# Patient Record
Sex: Male | Born: 1950 | Race: White | Hispanic: No | Smoking: Current every day smoker
Health system: Southern US, Community
[De-identification: ages and names within clinical notes are randomized; demographics above are authoritative.]

## PROBLEM LIST (undated history)

## (undated) DIAGNOSIS — M6530 Trigger finger, unspecified finger: Secondary | ICD-10-CM

## (undated) DIAGNOSIS — Z87442 Personal history of urinary calculi: Secondary | ICD-10-CM

## (undated) DIAGNOSIS — I10 Essential (primary) hypertension: Secondary | ICD-10-CM

## (undated) DIAGNOSIS — C801 Malignant (primary) neoplasm, unspecified: Secondary | ICD-10-CM

## (undated) DIAGNOSIS — E119 Type 2 diabetes mellitus without complications: Secondary | ICD-10-CM

## (undated) DIAGNOSIS — J439 Emphysema, unspecified: Secondary | ICD-10-CM

## (undated) DIAGNOSIS — H269 Unspecified cataract: Secondary | ICD-10-CM

## (undated) HISTORY — DX: Unspecified cataract: H26.9

## (undated) HISTORY — PX: CHOLECYSTECTOMY: SHX55

## (undated) HISTORY — DX: Malignant (primary) neoplasm, unspecified: C80.1

## (undated) HISTORY — DX: Type 2 diabetes mellitus without complications: E11.9

## (undated) HISTORY — PX: COLONOSCOPY: SHX174

## (undated) HISTORY — PX: KIDNEY STONE SURGERY: SHX686

## (undated) HISTORY — PX: HERNIA REPAIR: SHX51

## (undated) HISTORY — DX: Emphysema, unspecified: J43.9

---

## 2001-05-17 ENCOUNTER — Emergency Department (HOSPITAL_COMMUNITY): Admission: EM | Admit: 2001-05-17 | Discharge: 2001-05-17 | Payer: Self-pay | Admitting: Emergency Medicine

## 2001-05-17 ENCOUNTER — Encounter: Payer: Self-pay | Admitting: Emergency Medicine

## 2001-05-23 ENCOUNTER — Ambulatory Visit (HOSPITAL_BASED_OUTPATIENT_CLINIC_OR_DEPARTMENT_OTHER): Admission: RE | Admit: 2001-05-23 | Discharge: 2001-05-23 | Payer: Self-pay | Admitting: Urology

## 2001-05-23 ENCOUNTER — Encounter: Payer: Self-pay | Admitting: Urology

## 2007-01-04 DIAGNOSIS — C801 Malignant (primary) neoplasm, unspecified: Secondary | ICD-10-CM

## 2007-01-04 HISTORY — DX: Malignant (primary) neoplasm, unspecified: C80.1

## 2007-01-04 HISTORY — PX: PROSTATE SURGERY: SHX751

## 2007-04-11 ENCOUNTER — Ambulatory Visit (HOSPITAL_COMMUNITY): Admission: RE | Admit: 2007-04-11 | Discharge: 2007-04-11 | Payer: Self-pay | Admitting: Urology

## 2007-06-07 ENCOUNTER — Inpatient Hospital Stay (HOSPITAL_COMMUNITY): Admission: RE | Admit: 2007-06-07 | Discharge: 2007-06-08 | Payer: Self-pay | Admitting: Urology

## 2007-06-07 ENCOUNTER — Encounter (INDEPENDENT_AMBULATORY_CARE_PROVIDER_SITE_OTHER): Payer: Self-pay | Admitting: Urology

## 2008-04-17 ENCOUNTER — Encounter (INDEPENDENT_AMBULATORY_CARE_PROVIDER_SITE_OTHER): Payer: Self-pay | Admitting: *Deleted

## 2008-06-26 ENCOUNTER — Emergency Department (HOSPITAL_COMMUNITY): Admission: EM | Admit: 2008-06-26 | Discharge: 2008-06-26 | Payer: Self-pay | Admitting: Emergency Medicine

## 2010-04-12 LAB — URINALYSIS, ROUTINE W REFLEX MICROSCOPIC
Bilirubin Urine: NEGATIVE
Hgb urine dipstick: NEGATIVE
Ketones, ur: NEGATIVE mg/dL
Nitrite: NEGATIVE
Specific Gravity, Urine: 1.028 (ref 1.005–1.030)
Urobilinogen, UA: 0.2 mg/dL (ref 0.0–1.0)
pH: 5 (ref 5.0–8.0)

## 2010-04-12 LAB — POCT I-STAT, CHEM 8
Calcium, Ion: 1.1 mmol/L — ABNORMAL LOW (ref 1.12–1.32)
Glucose, Bld: 242 mg/dL — ABNORMAL HIGH (ref 70–99)
HCT: 48 % (ref 39.0–52.0)
Hemoglobin: 16.3 g/dL (ref 13.0–17.0)
TCO2: 23 mmol/L (ref 0–100)

## 2010-04-12 LAB — DIFFERENTIAL
Basophils Absolute: 0.1 10*3/uL (ref 0.0–0.1)
Basophils Relative: 0 % (ref 0–1)
Eosinophils Absolute: 0.2 10*3/uL (ref 0.0–0.7)
Monocytes Absolute: 0.6 10*3/uL (ref 0.1–1.0)
Monocytes Relative: 5 % (ref 3–12)

## 2010-04-12 LAB — CBC
Hemoglobin: 16.1 g/dL (ref 13.0–17.0)
MCHC: 34.4 g/dL (ref 30.0–36.0)
MCV: 89.5 fL (ref 78.0–100.0)
RBC: 5.23 MIL/uL (ref 4.22–5.81)
RDW: 13.1 % (ref 11.5–15.5)

## 2010-05-18 NOTE — Op Note (Signed)
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   PROCEDURES:  1. Robotic assisted laparoscopic radical prostatectomy (left nerve      sparing).  2. Bilateral laparoscopic pelvic lymphadenectomy.   SURGEON:  Heloise Purpura, MD   ASSISTANT:  Dr. Tarri Glenn.   ANESTHESIA:  General.   COMPLICATIONS:  None.   ESTIMATED BLOOD LOSS:  150 mL.   INTRAVENOUS FLUIDS:  2000 mL of lactated Ringer's.   SPECIMENS:  1. Prostate seminal vesicles.  2. Right pelvic lymph nodes.  3. Left pelvic lymph nodes.   DISPOSITION:  Specimen to pathology.   DRAINS:  1. 20 French coude catheter.  2. #19 Blake pelvic drain.   INDICATION:  Charles Sanders is a 60 year old gentleman with clinically  localized adenocarcinoma of the prostate.  After discussion regarding  management options for treatment, he elected to proceed with surgical  therapy in the above procedures.  Potential risks, complications and  alternative treatment options were discussed in detail with the patient  and informed consent was obtained.   DESCRIPTION OF PROCEDURE:  The patient was taken to the operating room  and a general anesthetic was administered.  He was given preoperative  antibiotics, placed in the dorsal lithotomy position, prepped and draped  in the usual sterile fashion.  Next a preoperative time-out was  performed.  A Foley catheter was inserted into the bladder and a site  was selected just to the  left of the umbilicus for placement of the  camera port.  This was placed using a standard open Hasson technique.  This allowed entry into the peritoneal cavity under direct vision and  without difficulty.  A 12 mm port was then placed and the zero degree  lens was used to inspect the abdomen and there was no evidence for any  intra-abdominal injuries or other abnormalities.  The remaining ports  were then placed.  Bilateral 8 mm robotic ports were placed 10 cm  lateral to and just inferior to the camera port site.  An additional 8  mm robotic port was placed in the far left lateral abdominal wall.  A 5  mm port was placed between the camera port and the right robotic port  and an additional 12 mm port was placed in the far right lateral  abdominal wall for laparoscopic assistance.  All ports were placed under  direct vision without difficulty.  The surgical cart was then docked.  With the aid of the cautery scissors, the bladder was reflected  posteriorly allowing entry into the space of Retzius and identification  of the  endopelvic fascia and prostate.  The endopelvic fascia was  incised from the apex back to the base of the prostate bilaterally and  the underlying levator muscle fibers were swept laterally off the  prostate thereby isolating the dorsal venous complex.  The dorsal vein  was then stapled and divided with a 45 mm flex ETS stapler.  The bladder  neck was identified with the aid of Foley catheter manipulation and was  divided anteriorly exposing the Foley catheter.  The catheter balloon  was deflated and the catheter was brought into the operative field and  used to retract the prostate anteriorly.  This exposed the posterior  bladder neck which was then divided and dissection proceeded between the  bladder neck and prostate until the vasa deferentia and seminal vesicles  were identified.  The vasa deferentia were isolated, divided and lifted  anteriorly.  The seminal  vesicles were then dissected with care to  control the seminal vesicle arterial blood supply.  The seminal vesicles  were then lifted anteriorly and the space between Denonvilliers fascia  and the anterior rectum was bluntly developed thereby isolating the  vascular pedicles of the prostate.  On the right side, the posterior  plane was noted to be somewhat stuck.  Based on his preoperative rectal  exam which did demonstrate a right apical nodule and these  intraoperative findings, it was decided to perform a wide non nerve  sparing procedure on the right side.  Therefore the vascular pedicles of  the prostate were ligated with Hem-o-lok clips and divided with sharp  cold scissor dissection widely around the prostate toward the prostatic  apex.  On the left side, the lateral prostatic fascia was incised  allowing the neurovascular bundles to be released from the prostate and  Hem-o-lok clips were then used to ligate the vascular pedicles of the  prostate between the neurovascular bundle and the prostate.  Sharp cold  scissor dissection was used and the neurovascular bundle was swept off  the apex of the prostate and urethra.  The urethra was then sharply  divided allowing the prostate specimen to disarticulate.  The pelvis was  then copiously irrigated and hemostasis was ensured.  There was no  evidence for a rectal injury.  Attention then turned to the right pelvic  sidewall.   The fibrofatty tissue between the external iliac vein, confluence of the  iliac vessels, hypogastric artery and Cooper's ligament was dissected  free from the pelvic sidewall with care to preserve the obturator nerve.  Hem-o-lok clips were used for lymphostasis and hemostasis.  An identical  procedure was then performed on the contralateral side.  Both lymphatic  packets were passed off for permanent pathologic analysis.  Attention  then turned to the pelvis.   There was noted to be some bleeding from the left  neurovascular bundle.  This was controlled superficially with a 3-0 Vicryl figure-of-eight  suture.  This resulted in excellent hemostasis and attention then turned  to the urethral anastomosis.  A 2-0 Vicryl slip-knot was placed between  Denonvilliers fascia, the posterior bladder neck and posterior urethra  to reapproximate these structures.  A double-armed 3-0 Monocryl suture  was then used to perform a 360 degree running tension-free anastomosis  between the bladder neck and urethra.  A new 20 [place] coude catheter  was inserted into the bladder and irrigated.  There were no blood clots  within the bladder and the anastomosis appeared to be watertight.  A #19  Harrison Mons  drain was then brought through the left robotic port and  appropriately positioned in the pelvis.  It was secured to the skin with  a nylon suture.  The surgical cart was then undocked.  The right lateral  12 mm port site was closed with a zero Vicryl suture placed with the aid  of the suture passer device.  All remaining ports were then removed  under direct vision.  The prostate specimen was removed intact within  the Endopouch retrieval bag via the periumbilical port site.  This  fascial opening was then closed with a  running zero Vicryl suture.  All port sites were then injected with  0.25% Marcaine and reapproximated at the skin level with staples.  Sterile dressings were applied.  The patient appeared to tolerate the  procedure well without complications.  He was able to be extubated and  transferred to the recovery unit in satisfactory condition.      Heloise Purpura, MD  Electronically Signed     LB/MEDQ  D:  06/07/2007  T:  06/07/2007  Job:  [id number]

## 2010-05-21 NOTE — Op Note (Signed)
[facility]  Patient:    Charles Sanders, Charles Sanders Visit Number: [id number] MRN: [id number]          Service Type: NES Location: [facility] Attending Physician:  Katherine Roan Dictated by:   Rozanna Boer., M.D. Proc. Date: 05/23/01 Admit Date:  05/23/2001 Discharge Date: 05/23/2001                             Operative Report  PREOPERATIVE DIAGNOSIS:  Left distal ureteral stone with obstruction.  POSTOPERATIVE DIAGNOSES: 1. Left distal ureteral stone with obstruction. 2. Slight urinary extravasation distal left ureter.  ANESTHESIA:  General.  SURGEON:  Rozanna Boer., M.D.  BRIEF HISTORY:  This 60 year old white male presents with a left distal ureteral stone, his first; about 4 to 5 mm in size. The stone presented on May 17, 2001 and has not passed since then. He has required a trip to the emergency room and a trip to my office for pain and is due to go to [place] in five days and wants to go ahead and have the stone removed at this time.  DESCRIPTION OF PROCEDURE:  The patient was placed on the operating table in dorsal lithotomy position after satisfactory induction of general anesthesia. He was give Ancef IV. The #21 panendoscope was used to inspect the urethra. No anterior stricture was seen. Posterior urethra was nonobstructing. The bladder was entered. The left orifice was quite edematous and the angle was somewhat oblique. I inserted the #6 ureteral catheter, open ended, and did an occlusive retrograde which demonstrated a little bit of urinary extravasation distally. I tried numerous times to get both a guidewire and a Glidewire through the lumen but was unsuccessful to negotiate this. Then with the #6 short ureteroscope in the lumen I could see the false passage and then just anterior and lateral to this, I saw the true lumen. I was able to get the scope in this and then saw the stone. I was able to pass a  guidewire through the ureteroscope under fluoroscopy at the level of the kidney. I removed the ureteroscope and then passed a 4-cm balloon over the guidewire and inflated this to 12 atmospheres for five timed minutes. The balloon was then removed leaving the guidewire in place. The #6 short ureteroscope was then passed into the ureter beyond the stone which was then grasped with a Segura #4 wire basket and removed intact. Over the guidewire, a #6 [place] 24-cm length Double J ureteral stent was positioned in good position as the guidewire was removed. Because of the extravasation in the distal ureter, I will leave the stent for one to two weeks and therefore cut the string on the distal end, drained his bladder, gave him some Toradol IV and B&O suppository. He was sent to recovery room in good condition. He will later discharged home with detailed written instructions. Dictated by:   Rozanna Boer., M.D. Attending Physician:  Katherine Roan DD:  05/23/01 TD:  05/25/01 Job: [id number]

## 2010-09-30 LAB — HEMOGLOBIN AND HEMATOCRIT, BLOOD
HCT: 41.2
Hemoglobin: 13.8
Hemoglobin: 14.3

## 2010-09-30 LAB — BASIC METABOLIC PANEL
CO2: 30
Chloride: 102
GFR calc Af Amer: >60
Sodium: 139

## 2010-09-30 LAB — CBC
Hemoglobin: 16.5
MCHC: 35
MCV: 87.1
Platelets: 157
RBC: 5.39
WBC: 9.5

## 2010-09-30 LAB — TYPE AND SCREEN: Antibody Screen: NEGATIVE

## 2011-12-09 ENCOUNTER — Other Ambulatory Visit: Payer: Self-pay | Admitting: Orthopedic Surgery

## 2011-12-13 NOTE — Progress Notes (Signed)
Pre procedure instructions given-bring meds

## 2011-12-14 ENCOUNTER — Encounter (HOSPITAL_BASED_OUTPATIENT_CLINIC_OR_DEPARTMENT_OTHER)
Admission: RE | Disposition: A | Discharge status: Home / Self Care | Payer: Self-pay | Source: Ambulatory Visit | Attending: Orthopedic Surgery

## 2011-12-14 ENCOUNTER — Ambulatory Visit (HOSPITAL_BASED_OUTPATIENT_CLINIC_OR_DEPARTMENT_OTHER)
Admission: RE | Admit: 2011-12-14 | Discharge: 2011-12-14 | Disposition: A | Discharge status: Home / Self Care | Payer: BC Managed Care – PPO | Source: Ambulatory Visit | Attending: Orthopedic Surgery | Admitting: Orthopedic Surgery

## 2011-12-14 DIAGNOSIS — M6530 Trigger finger, unspecified finger: Secondary | ICD-10-CM | POA: Insufficient documentation

## 2011-12-14 HISTORY — PX: TRIGGER FINGER RELEASE: SHX641

## 2011-12-14 SURGERY — MINOR RELEASE TRIGGER FINGER/A-1 PULLEY
Anesthesia: LOCAL | Site: Finger | Laterality: Left | Wound class: Clean

## 2011-12-14 MED ORDER — CHLORHEXIDINE GLUCONATE 4 % EX LIQD: 60.0000 mL | Freq: Once | CUTANEOUS | Status: DC

## 2011-12-14 MED ORDER — LIDOCAINE HCL 2 % IJ SOLN
INTRAMUSCULAR | Status: DC | PRN
  Administered 2011-12-14: 3 mL

## 2011-12-14 MED ORDER — TRAMADOL HCL 50 MG PO TABS: ORAL_TABLET | ORAL | Status: DC

## 2011-12-14 SURGICAL SUPPLY — 37 items
BANDAGE ADHESIVE 1X3 (GAUZE/BANDAGES/DRESSINGS) IMPLANT
BLADE SURG 15 STRL LF DISP TIS (BLADE) ×1 IMPLANT
BLADE SURG 15 STRL SS (BLADE) ×2
BNDG CMPR 9X4 STRL LF SNTH (GAUZE/BANDAGES/DRESSINGS)
BNDG CMPR MD 5X2 ELC HKLP STRL (GAUZE/BANDAGES/DRESSINGS) ×1
BNDG ELASTIC 2 VLCR STRL LF (GAUZE/BANDAGES/DRESSINGS) ×2 IMPLANT
BNDG ESMARK 4X9 LF (GAUZE/BANDAGES/DRESSINGS) IMPLANT
BRUSH SCRUB EZ PLAIN DRY (MISCELLANEOUS) ×2 IMPLANT
CLOTH BEACON ORANGE TIMEOUT ST (SAFETY) ×1 IMPLANT
CORDS BIPOLAR (ELECTRODE) IMPLANT
COVER MAYO STAND STRL (DRAPES) ×2 IMPLANT
COVER TABLE BACK 60X90 (DRAPES) IMPLANT
CUFF TOURNIQUET SINGLE 18IN (TOURNIQUET CUFF) ×1 IMPLANT
DECANTER SPIKE VIAL GLASS SM (MISCELLANEOUS) IMPLANT
DRAPE SURG 17X23 STRL (DRAPES) ×2 IMPLANT
GAUZE SPONGE 4X4 12PLY STRL LF (GAUZE/BANDAGES/DRESSINGS) ×4 IMPLANT
GLOVE BIO SURGEON STRL SZ 6.5 (GLOVE) ×1 IMPLANT
GLOVE BIOGEL M STRL SZ7.5 (GLOVE) ×2 IMPLANT
GLOVE BIOGEL PI IND STRL 7.0 (GLOVE) IMPLANT
GLOVE BIOGEL PI INDICATOR 7.0 (GLOVE) ×1
GLOVE ORTHO TXT STRL SZ7.5 (GLOVE) ×2 IMPLANT
GOWN PREVENTION PLUS XLARGE (GOWN DISPOSABLE) ×2 IMPLANT
NEEDLE 27GAX1X1/2 (NEEDLE) ×3 IMPLANT
PACK BASIN DAY SURGERY FS (CUSTOM PROCEDURE TRAY) IMPLANT
PADDING CAST ABS 4INX4YD NS (CAST SUPPLIES) ×1
PADDING CAST ABS COTTON 4X4 ST (CAST SUPPLIES) ×1 IMPLANT
SPONGE GAUZE 4X4 12PLY (GAUZE/BANDAGES/DRESSINGS) ×2 IMPLANT
STOCKINETTE 4X48 STRL (DRAPES) ×2 IMPLANT
STRIP CLOSURE SKIN 1/2X4 (GAUZE/BANDAGES/DRESSINGS) ×2 IMPLANT
SUT PROLENE 3 0 PS 2 (SUTURE) ×2 IMPLANT
SUT PROLENE 4 0 P 3 18 (SUTURE) ×1 IMPLANT
SYR 3ML 23GX1 SAFETY (SYRINGE) IMPLANT
SYR CONTROL 10ML LL (SYRINGE) ×3 IMPLANT
TOWEL OR 17X24 6PK STRL BLUE (TOWEL DISPOSABLE) ×4 IMPLANT
TRAY DSU PREP LF (CUSTOM PROCEDURE TRAY) ×2 IMPLANT
UNDERPAD 30X30 INCONTINENT (UNDERPADS AND DIAPERS) ×2 IMPLANT
WATER STERILE IRR 1000ML POUR (IV SOLUTION) IMPLANT

## 2011-12-14 NOTE — Brief Op Note (Signed)
12/14/2011  10:07 AM  PATIENT:  Charles Sanders  61 y.o. male  PRE-OPERATIVE DIAGNOSIS:  Left Ring Finger Trigger  POST-OPERATIVE DIAGNOSIS:  left ring trigger finger  PROCEDURE:  Procedure(s) (LRB) with comments: MINOR RELEASE TRIGGER FINGER/A-1 PULLEY (Left) - ring  SURGEON:  Surgeon(s) and Role:    * Wyn Forster., MD - Primary  PHYSICIAN ASSISTANT:   ASSISTANTS: Mallory Shirk.A-C   ANESTHESIA:   local  EBL:     BLOOD ADMINISTERED:none  DRAINS: none   LOCAL MEDICATIONS USED:  XYLOCAINE   SPECIMEN:  No Specimen  DISPOSITION OF SPECIMEN:  N/A  COUNTS:  YES  TOURNIQUET:   Total Tourniquet Time Documented: Upper Arm (Left) - 6 minutes  DICTATION: .Other Dictation: Dictation Number [id number]  PLAN OF CARE: Discharge to home after PACU  PATIENT DISPOSITION:  PACU - hemodynamically stable.

## 2011-12-14 NOTE — Op Note (Signed)
[id number]

## 2011-12-14 NOTE — H&P (Signed)
    Charles Sanders was seen back for follow-up consult regarding his locking left ring finger on 03/30/11.  Trey Paula has done very well following injury to his shoulder as well as treatment of prior trigger fingers.  He has been very well controlled on your diabetes management regimen.  His hemoglobin A1C last checked was 6.1.  On examination at this time he is tender over his left ring finger A-1 pulley.  He has locking in flexion.  After informed consent and alcohol/Betadine prep he is injected with Depo Medrol and Lidocaine into his left ring finger flexor sheath.  This was well tolerated. He did well for about 4 months following the injection with complete resolution of his symptoms. He did however develop recurrent symptoms of stenosing tenosynovitis with active triggering especially in the morning hours. He now desires to undergo surgical intervention for this predicament. The procedure, risks, benefits and post-op course were discussed at length and he was in agreement with the plan.  Jonni Sanger PA-C  H&P documentation: 12/14/2011  -History and Physical Reviewed  -Patient has been re-examined  -No change in the plan of care  Wyn Forster, MD

## 2011-12-15 ENCOUNTER — Encounter (HOSPITAL_BASED_OUTPATIENT_CLINIC_OR_DEPARTMENT_OTHER): Payer: Self-pay | Admitting: Orthopedic Surgery

## 2011-12-16 NOTE — Op Note (Signed)
NAMEAVELARDO, REESMAN            ACCOUNT NO.:  [id number]  MEDICAL RECORD NO.:  [id number]  LOCATION:                                 FACILITY:  PHYSICIAN:  Katy Fitch. Emberlyn Burlison, M.D. 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  OPERATION:  Release of left ring finger A1 pulley.  OPERATING SURGEON:  Katy Fitch. Abbrielle Batts, MD  ASSISTANT:  Marveen Reeks Dasnoit, PA-C  ANESTHESIA:  Lidocaine 2% palmar block and flexor sheath block of left ring finger.  This was performed as a minor operating room procedure.  INDICATIONS:  Charles Sanders is a 61 year old gentleman, who has had a history of type 2 diabetes and chronic stenosing tenosynovitis of the left ring finger at the A1 pulley.  He has failed nonoperative measures.  Therefore he returns at this time requesting release of the A1 pulley.  Preoperatively he had detailed anesthesia and surgery informed consent. He understands that we will be performing this under local anesthesia so that he may demonstrate full active range of motion of his finger once the A1 pulley is released.  We will look for accessory pulleys as well. The surgery and aftercare were described in detail.  PROCEDURE:  Charles Sanders was brought to room 1 of the [facility] and placed in supine position on the operating table.  Following Betadine prep of his palm and anesthesia informed consent, we infiltrated 3 mL of 2% lidocaine into the path of the intended incision and the flexor sheath of the left ring finger.  After 5 minutes, excellent anesthesia was achieved.  The left hand and arm were then prepped with Betadine soap and solution, sterilely draped.  A pneumatic tourniquet was applied to  the proximal left forearm.  Following exsanguination of left arm by direct compression, the arterial tourniquet was inflated to 220 mmHg.  After confirming satisfactory anesthesia, a routine surgical time-out was called.  We then proceeded to perform a 1 cm oblique incision directly over the A1 pulley.  Subcutaneous tissues were carefully divided, taking care to identify and release the palmar fascia, pretendinous fibers.  Four Ragnell retractors were placed revealing the A1 pulley.  This was split with scalpel scissors.  There was no A0 pulley proximally.  After release of the pulley, Charles Sanders demonstrated full active range of motion of his finger in flexion and extension.  A minimal tenosynovectomy was performed  The wound was then repaired with interrupted suture of 5-0 nylon x2. For aftercare, he was placed in compressive dressing with Xeroflo sterile gauze and Ace wrap.  He will continue with range of motion exercises.  We will see him back for followup in 1 week to remove his dressing and sutures.  He is advised that he may remove the dressing and begin using Band-Aids after 3 days.  For aftercare, he is provided a prescription for Ultram 50 mg 1 p.o. q.4- 6 hours p.r.n. pain, 20 tablets without refill.  Katy Fitch Angely Dietz, M.D.     RVS/MEDQ  D:  12/14/2011  T:  12/14/2011  Job:  [id number]  cc:   Charles Sanders, M.D.

## 2013-04-03 ENCOUNTER — Encounter: Payer: Self-pay | Admitting: Internal Medicine

## 2013-04-24 ENCOUNTER — Encounter: Payer: Self-pay | Admitting: Internal Medicine

## 2013-06-03 ENCOUNTER — Encounter: Payer: Self-pay | Admitting: Internal Medicine

## 2013-08-06 ENCOUNTER — Ambulatory Visit (AMBULATORY_SURGERY_CENTER): Payer: Medicare Other | Admitting: *Deleted

## 2013-08-06 VITALS — Ht 68.0 in | Wt 164.0 lb

## 2013-08-06 DIAGNOSIS — Z800 Family history of malignant neoplasm of digestive organs: Secondary | ICD-10-CM

## 2013-08-06 MED ORDER — NA SULFATE-K SULFATE-MG SULF 17.5-3.13-1.6 GM/177ML PO SOLN: 1.0000 | Freq: Once | ORAL | Status: DC

## 2013-08-06 NOTE — Progress Notes (Signed)
No egg or soy allergy. No anesthesia problems.  No home O2.  No diet meds.  

## 2013-08-20 ENCOUNTER — Encounter: Payer: Self-pay | Admitting: Internal Medicine

## 2013-08-20 ENCOUNTER — Ambulatory Visit (AMBULATORY_SURGERY_CENTER): Payer: Medicare Other | Admitting: Internal Medicine

## 2013-08-20 VITALS — BP 112/72 | HR 78 | Temp 97.0°F | Resp 18 | Ht 68.0 in | Wt 164.0 lb

## 2013-08-20 DIAGNOSIS — Z1211 Encounter for screening for malignant neoplasm of colon: Secondary | ICD-10-CM

## 2013-08-20 DIAGNOSIS — D129 Benign neoplasm of anus and anal canal: Secondary | ICD-10-CM

## 2013-08-20 DIAGNOSIS — K5730 Diverticulosis of large intestine without perforation or abscess without bleeding: Secondary | ICD-10-CM

## 2013-08-20 DIAGNOSIS — D128 Benign neoplasm of rectum: Secondary | ICD-10-CM

## 2013-08-20 LAB — GLUCOSE, CAPILLARY
GLUCOSE-CAPILLARY: 150 mg/dL — AB (ref 70–99)
Glucose-Capillary: 147 mg/dL — ABNORMAL HIGH (ref 70–99)

## 2013-08-20 MED ORDER — SODIUM CHLORIDE 0.9 % IV SOLN: 500.0000 mL | INTRAVENOUS | Status: DC

## 2013-08-20 NOTE — Progress Notes (Signed)
Called to room to assist during endoscopic procedure.  Patient ID and intended procedure confirmed with present staff. Received instructions for my participation in the procedure from the performing physician.  

## 2013-08-20 NOTE — Patient Instructions (Addendum)
I found and removed a tiny rectal polyp that looked benign. I was not able to pick it up to send to pathology but it was very small and removed. Most of these polyps are NOT pre-cancerous. I think you can wait another 10 years to repeat a routine colonoscopy.  You also have a condition called diverticulosis - common and not usually a problem. Please read the handout provided.  Next routine colonoscopy in 10 years - 2025  I appreciate the opportunity to care for you. Charles Mayer, MD, FACG   YOU HAD AN ENDOSCOPIC PROCEDURE TODAY AT [facility]: Refer to the procedure report that was given to you for any specific questions about what was found during the examination.  If the procedure report does not answer your questions, please call your gastroenterologist to clarify.  If you requested that your care partner not be given the details of your procedure findings, then the procedure report has been included in a sealed envelope for you to review at your convenience later.  YOU SHOULD EXPECT: Some feelings of bloating in the abdomen. Passage of more gas than usual.  Walking can help get rid of the air that was put into your GI tract during the procedure and reduce the bloating. If you had a lower endoscopy (such as a colonoscopy or flexible sigmoidoscopy) you may notice spotting of blood in your stool or on the toilet paper. If you underwent a bowel prep for your procedure, then you may not have a normal bowel movement for a few days.  DIET: Your first meal following the procedure should be a light meal and then it is ok to progress to your normal diet.  A half-sandwich or bowl of soup is an example of a good first meal.  Heavy or fried foods are harder to digest and may make you feel nauseous or bloated.  Likewise meals heavy in dairy and vegetables can cause extra gas to form and this can also increase the bloating.  Drink plenty of fluids but you should avoid alcoholic beverages for  24 hours.  ACTIVITY: Your care partner should take you home directly after the procedure.  You should plan to take it easy, moving slowly for the rest of the day.  You can resume normal activity the day after the procedure however you should NOT DRIVE or use heavy machinery for 24 hours (because of the sedation medicines used during the test).    SYMPTOMS TO REPORT IMMEDIATELY: A gastroenterologist can be reached at any hour.  During normal business hours, 8:30 AM to 5:00 PM Monday through Friday, call [phone].  After hours and on weekends, please call the GI answering service at [phone] who will take a message and have the physician on call contact you.   Following lower endoscopy (colonoscopy or flexible sigmoidoscopy):  Excessive amounts of blood in the stool  Significant tenderness or worsening of abdominal pains  Swelling of the abdomen that is new, acute  Fever of 100F or higher    FOLLOW UP: If any biopsies were taken you will be contacted by phone or by letter within the next 1-3 weeks.  Call your gastroenterologist if you have not heard about the biopsies in 3 weeks.  Our staff will call the home number listed on your records the next business day following your procedure to check on you and address any questions or concerns that you may have at that time regarding the information given to  you following your procedure. This is a courtesy call and so if there is no answer at the home number and we have not heard from you through the emergency physician on call, we will assume that you have returned to your regular daily activities without incident.  SIGNATURES/CONFIDENTIALITY: You and/or your care partner have signed paperwork which will be entered into your electronic medical record.  These signatures attest to the fact that that the information above on your After Visit Summary has been reviewed and is understood.  Full responsibility of the confidentiality of this  discharge information lies with you and/or your care-partner.  Diverticulosis Diverticulosis is the condition that develops when small pouches (diverticula) form in the wall of your colon. Your colon, or large intestine, is where water is absorbed and stool is formed. The pouches form when the inside layer of your colon pushes through weak spots in the outer layers of your colon. CAUSES  No one knows exactly what causes diverticulosis. RISK FACTORS  Being older than 51. Your risk for this condition increases with age. Diverticulosis is rare in people younger than 40 years. By age 25, almost everyone has it.  Eating a low-fiber diet.  Being frequently constipated.  Being overweight.  Not getting enough exercise.  Smoking.  Taking over-the-counter pain medicines, like aspirin and ibuprofen. SYMPTOMS  Most people with diverticulosis do not have symptoms. DIAGNOSIS  Because diverticulosis often has no symptoms, health care providers often discover the condition during an exam for other colon problems. In many cases, a health care provider will diagnose diverticulosis while using a flexible scope to examine the colon (colonoscopy). TREATMENT  If you have never developed an infection related to diverticulosis, you may not need treatment. If you have had an infection before, treatment may include:  Eating more fruits, vegetables, and grains.  Taking a fiber supplement.  Taking a live bacteria supplement (probiotic).  Taking medicine to relax your colon. HOME CARE INSTRUCTIONS   Drink at least 6-8 glasses of water each day to prevent constipation.  Try not to strain when you have a bowel movement.  Keep all follow-up appointments. If you have had an infection before:  Increase the fiber in your diet as directed by your health care provider or dietitian.  Take a dietary fiber supplement if your health care provider approves.  Only take medicines as directed by your health care  provider. SEEK MEDICAL CARE IF:   You have abdominal pain.  You have bloating.  You have cramps.  You have not gone to the bathroom in 3 days. SEEK IMMEDIATE MEDICAL CARE IF:   Your pain gets worse.  Yourbloating becomes very bad.  You have a fever or chills, and your symptoms suddenly get worse.  You begin vomiting.  You have bowel movements that are bloody or black. MAKE SURE YOU:  Understand these instructions.  Will watch your condition.  Will get help right away if you are not doing well or get worse. Document Released: 09/17/2003 Document Revised: 12/25/2012 Document Reviewed: 11/14/2012 [facility] Patient Information 2015 [place], [place]. This information is not intended to replace advice given to you by your health care provider. Make sure you discuss any questions you have with your health care provider.

## 2013-08-20 NOTE — Op Note (Signed)
[place]  [employer] [place], [id number]   COLONOSCOPY PROCEDURE REPORT  PATIENT: Charles Sanders, Charles Sanders  MR#: [id number] BIRTHDATE: 02/24/1950 , 68  yrs. old GENDER: Male ENDOSCOPIST: Gatha Mayer, MD, [facility] PROCEDURE DATE:  08/20/2013 PROCEDURE:   Colonoscopy with snare polypectomy First Screening Colonoscopy - Avg.  risk and is 50 yrs.  old or older - No.  Prior Negative Screening - Now for repeat screening. 10 or more years since last screening  History of Adenoma - Now for follow-up colonoscopy & has been > or = to 3 yrs.  N/A  Polyps Removed Today? Yes. ASA CLASS:   Class II INDICATIONS:average risk screening and Last colonoscopy performed 10 years ago. MEDICATIONS: propofol (Diprivan) 250mg  IV, MAC sedation, administered by CRNA, and These medications were titrated to patient response per physician's verbal order  DESCRIPTION OF PROCEDURE:   After the risks benefits and alternatives of the procedure were thoroughly explained, informed consent was obtained.  A digital rectal exam revealed a surgically absent prostate.   The [id number]  endoscope was introduced through the anus and advanced to the cecum, which was identified by both the appendix and ileocecal valve. No adverse events experienced.   The quality of the prep was excellent using Suprep  The instrument was then slowly withdrawn as the colon was fully examined.  COLON FINDINGS: A diminutive sessile polyp was found in the rectum. A polypectomy was performed with a cold snare.  The resection was complete but the polyp tissue was not retrieved.   Severe diverticulosis was noted.   The colon mucosa was otherwise normal. A right colon retroflexion was performed.  Retroflexed views revealed no abnormalities. The time to cecum=2 minutes 18 seconds. Withdrawal time=8 minutes 16 seconds.  The scope was withdrawn and the procedure completed. COMPLICATIONS: There were no  complications.  ENDOSCOPIC IMPRESSION: 1.   Diminutive sessile polyp was found in the rectum; polypectomy was performed with a cold snare but nort recovered - endoscopic characteristics of hyperplastic polyp and tiny and single so 10 year f/u ok 2.   Severe diverticulosis was noted 3.   The colon mucosa was otherwise normal - excellent prep  RECOMMENDATIONS: Repeat Colonscopy in 10 years.   eSigned:  Gatha Mayer, MD, [facility] 08/20/2013 9:06 AM   cc: The Patient, Reynold Bowen, MD, and Raynelle Bring, MD

## 2013-08-20 NOTE — Progress Notes (Signed)
A/ox3, pleased with MAC, report to RN 

## 2013-08-21 ENCOUNTER — Telehealth: Payer: Self-pay | Admitting: *Deleted

## 2013-08-21 NOTE — Telephone Encounter (Signed)
  Follow up Call-  Call back number 08/20/2013  Post procedure Call Back phone  # [phone]  Permission to leave phone message Yes     No answer, left message.

## 2014-03-26 ENCOUNTER — Encounter (HOSPITAL_BASED_OUTPATIENT_CLINIC_OR_DEPARTMENT_OTHER)
Admission: RE | Admit: 2014-03-26 | Discharge: 2014-03-26 | Disposition: A | Discharge status: Home / Self Care | Payer: Medicare Other | Source: Ambulatory Visit | Attending: Orthopedic Surgery | Admitting: Orthopedic Surgery

## 2014-03-26 ENCOUNTER — Encounter (HOSPITAL_BASED_OUTPATIENT_CLINIC_OR_DEPARTMENT_OTHER): Payer: Self-pay | Admitting: *Deleted

## 2014-03-26 ENCOUNTER — Other Ambulatory Visit: Payer: Self-pay | Admitting: Orthopedic Surgery

## 2014-03-26 DIAGNOSIS — G5602 Carpal tunnel syndrome, left upper limb: Secondary | ICD-10-CM | POA: Diagnosis not present

## 2014-03-26 DIAGNOSIS — F17210 Nicotine dependence, cigarettes, uncomplicated: Secondary | ICD-10-CM | POA: Diagnosis not present

## 2014-03-26 DIAGNOSIS — E119 Type 2 diabetes mellitus without complications: Secondary | ICD-10-CM | POA: Diagnosis not present

## 2014-03-26 DIAGNOSIS — Z7982 Long term (current) use of aspirin: Secondary | ICD-10-CM | POA: Diagnosis not present

## 2014-03-26 DIAGNOSIS — F1099 Alcohol use, unspecified with unspecified alcohol-induced disorder: Secondary | ICD-10-CM | POA: Diagnosis not present

## 2014-03-26 DIAGNOSIS — Z9049 Acquired absence of other specified parts of digestive tract: Secondary | ICD-10-CM | POA: Diagnosis not present

## 2014-03-26 DIAGNOSIS — Z87442 Personal history of urinary calculi: Secondary | ICD-10-CM | POA: Diagnosis not present

## 2014-03-26 LAB — BASIC METABOLIC PANEL
Anion gap: 6 (ref 5–15)
BUN: 14 mg/dL (ref 6–23)
CALCIUM: 9.4 mg/dL (ref 8.4–10.5)
CO2: 36 mmol/L — AB (ref 19–32)
Chloride: 97 mmol/L (ref 96–112)
Creatinine, Ser: 0.75 mg/dL (ref 0.50–1.35)
GFR calc Af Amer: >90 mL/min (ref >90)
GFR calc non Af Amer: >90 mL/min (ref >90)
Glucose, Bld: 224 mg/dL — ABNORMAL HIGH (ref 70–99)
Potassium: 3.4 mmol/L — ABNORMAL LOW (ref 3.5–5.1)
SODIUM: 139 mmol/L (ref 135–145)

## 2014-03-26 NOTE — Progress Notes (Signed)
To come in for [id number]-bmet 

## 2014-03-27 ENCOUNTER — Encounter (HOSPITAL_BASED_OUTPATIENT_CLINIC_OR_DEPARTMENT_OTHER)
Admission: RE | Disposition: A | Discharge status: Home / Self Care | Payer: Self-pay | Source: Ambulatory Visit | Attending: Orthopedic Surgery

## 2014-03-27 ENCOUNTER — Encounter (HOSPITAL_BASED_OUTPATIENT_CLINIC_OR_DEPARTMENT_OTHER): Payer: Self-pay | Admitting: Anesthesiology

## 2014-03-27 ENCOUNTER — Ambulatory Visit (HOSPITAL_BASED_OUTPATIENT_CLINIC_OR_DEPARTMENT_OTHER)
Admission: RE | Admit: 2014-03-27 | Discharge: 2014-03-27 | Disposition: A | Discharge status: Home / Self Care | Payer: Medicare Other | Source: Ambulatory Visit | Attending: Orthopedic Surgery | Admitting: Orthopedic Surgery

## 2014-03-27 ENCOUNTER — Ambulatory Visit (HOSPITAL_BASED_OUTPATIENT_CLINIC_OR_DEPARTMENT_OTHER): Payer: Medicare Other | Admitting: Anesthesiology

## 2014-03-27 DIAGNOSIS — E119 Type 2 diabetes mellitus without complications: Secondary | ICD-10-CM | POA: Insufficient documentation

## 2014-03-27 DIAGNOSIS — F1099 Alcohol use, unspecified with unspecified alcohol-induced disorder: Secondary | ICD-10-CM | POA: Diagnosis not present

## 2014-03-27 DIAGNOSIS — Z87442 Personal history of urinary calculi: Secondary | ICD-10-CM | POA: Insufficient documentation

## 2014-03-27 DIAGNOSIS — F17210 Nicotine dependence, cigarettes, uncomplicated: Secondary | ICD-10-CM | POA: Diagnosis not present

## 2014-03-27 DIAGNOSIS — G5602 Carpal tunnel syndrome, left upper limb: Secondary | ICD-10-CM | POA: Insufficient documentation

## 2014-03-27 DIAGNOSIS — Z7982 Long term (current) use of aspirin: Secondary | ICD-10-CM | POA: Insufficient documentation

## 2014-03-27 DIAGNOSIS — Z9049 Acquired absence of other specified parts of digestive tract: Secondary | ICD-10-CM | POA: Insufficient documentation

## 2014-03-27 HISTORY — PX: CARPAL TUNNEL RELEASE: SHX101

## 2014-03-27 LAB — POCT HEMOGLOBIN-HEMACUE: HEMOGLOBIN: 16.2 g/dL (ref 13.0–17.0)

## 2014-03-27 LAB — GLUCOSE, CAPILLARY
Glucose-Capillary: 204 mg/dL — ABNORMAL HIGH (ref 70–99)
Glucose-Capillary: 165 mg/dL — ABNORMAL HIGH (ref 70–99)

## 2014-03-27 SURGERY — CARPAL TUNNEL RELEASE
Anesthesia: General | Site: Wrist | Laterality: Left

## 2014-03-27 MED ORDER — DEXAMETHASONE SODIUM PHOSPHATE 4 MG/ML IJ SOLN
INTRAMUSCULAR | Status: DC | PRN
  Administered 2014-03-27: 10 mg via INTRAVENOUS

## 2014-03-27 MED ORDER — BUPIVACAINE HCL (PF) 0.25 % IJ SOLN
INTRAMUSCULAR | Status: DC | PRN
  Administered 2014-03-27: 10 mL

## 2014-03-27 MED ORDER — CHLORHEXIDINE GLUCONATE 4 % EX LIQD: 60.0000 mL | Freq: Once | CUTANEOUS | Status: DC

## 2014-03-27 MED ORDER — LACTATED RINGERS IV SOLN
INTRAVENOUS | Status: DC
  Administered 2014-03-27: 10:00:00 via INTRAVENOUS

## 2014-03-27 MED ORDER — LIDOCAINE HCL (CARDIAC) 20 MG/ML IV SOLN
INTRAVENOUS | Status: DC | PRN
  Administered 2014-03-27: 50 mg via INTRAVENOUS

## 2014-03-27 MED ORDER — CEFAZOLIN SODIUM-DEXTROSE 2-3 GM-% IV SOLR: 2.0000 g | INTRAVENOUS | Status: DC

## 2014-03-27 MED ORDER — MIDAZOLAM HCL 2 MG/2ML IJ SOLN: 1.0000 mg | INTRAMUSCULAR | Status: DC | PRN

## 2014-03-27 MED ORDER — FENTANYL CITRATE 0.05 MG/ML IJ SOLN
INTRAMUSCULAR | Status: AC
  Filled 2014-03-27: qty 6

## 2014-03-27 MED ORDER — OXYCODONE HCL 5 MG/5ML PO SOLN: 5.0000 mg | Freq: Once | ORAL | Status: DC | PRN

## 2014-03-27 MED ORDER — FENTANYL CITRATE 0.05 MG/ML IJ SOLN: 50.0000 ug | INTRAMUSCULAR | Status: DC | PRN

## 2014-03-27 MED ORDER — CEFAZOLIN SODIUM-DEXTROSE 2-3 GM-% IV SOLR
INTRAVENOUS | Status: DC | PRN
Start: 2014-03-27 — End: 2014-03-27
  Administered 2014-03-27: 2 g via INTRAVENOUS

## 2014-03-27 MED ORDER — BUPIVACAINE HCL (PF) 0.25 % IJ SOLN
INTRAMUSCULAR | Status: AC
  Filled 2014-03-27: qty 30

## 2014-03-27 MED ORDER — ONDANSETRON HCL 4 MG/2ML IJ SOLN: 4.0000 mg | Freq: Once | INTRAMUSCULAR | Status: DC | PRN

## 2014-03-27 MED ORDER — CEFAZOLIN SODIUM-DEXTROSE 2-3 GM-% IV SOLR
INTRAVENOUS | Status: AC
  Filled 2014-03-27: qty 50

## 2014-03-27 MED ORDER — HYDROMORPHONE HCL 1 MG/ML IJ SOLN: 0.2500 mg | INTRAMUSCULAR | Status: DC | PRN

## 2014-03-27 MED ORDER — FENTANYL CITRATE 0.05 MG/ML IJ SOLN
INTRAMUSCULAR | Status: DC | PRN
  Administered 2014-03-27 (×2): 50 ug via INTRAVENOUS

## 2014-03-27 MED ORDER — OXYCODONE HCL 5 MG PO TABS: 5.0000 mg | ORAL_TABLET | Freq: Once | ORAL | Status: DC | PRN

## 2014-03-27 MED ORDER — MIDAZOLAM HCL 2 MG/2ML IJ SOLN
INTRAMUSCULAR | Status: AC
  Filled 2014-03-27: qty 2

## 2014-03-27 MED ORDER — ONDANSETRON HCL 4 MG/2ML IJ SOLN
INTRAMUSCULAR | Status: DC | PRN
  Administered 2014-03-27: 4 mg via INTRAVENOUS

## 2014-03-27 MED ORDER — MIDAZOLAM HCL 5 MG/5ML IJ SOLN
INTRAMUSCULAR | Status: DC | PRN
  Administered 2014-03-27: 2 mg via INTRAVENOUS

## 2014-03-27 SURGICAL SUPPLY — 36 items
BLADE SURG 15 STRL LF DISP TIS (BLADE) ×1 IMPLANT
BLADE SURG 15 STRL SS (BLADE) ×3
BNDG CMPR 9X4 STRL LF SNTH (GAUZE/BANDAGES/DRESSINGS) ×1
BNDG COHESIVE 3X5 TAN STRL LF (GAUZE/BANDAGES/DRESSINGS) ×3 IMPLANT
BNDG ESMARK 4X9 LF (GAUZE/BANDAGES/DRESSINGS) ×2 IMPLANT
BNDG GAUZE ELAST 4 BULKY (GAUZE/BANDAGES/DRESSINGS) ×3 IMPLANT
CHLORAPREP W/TINT 26ML (MISCELLANEOUS) ×3 IMPLANT
CORDS BIPOLAR (ELECTRODE) ×3 IMPLANT
COVER BACK TABLE 60X90IN (DRAPES) ×3 IMPLANT
COVER MAYO STAND STRL (DRAPES) ×3 IMPLANT
CUFF TOURNIQUET SINGLE 18IN (TOURNIQUET CUFF) ×3 IMPLANT
DRAPE EXTREMITY T 121X128X90 (DRAPE) ×3 IMPLANT
DRAPE SURG 17X23 STRL (DRAPES) ×3 IMPLANT
DRSG PAD ABDOMINAL 8X10 ST (GAUZE/BANDAGES/DRESSINGS) ×3 IMPLANT
GAUZE SPONGE 4X4 12PLY STRL (GAUZE/BANDAGES/DRESSINGS) ×3 IMPLANT
GAUZE XEROFORM 1X8 LF (GAUZE/BANDAGES/DRESSINGS) ×3 IMPLANT
GLOVE BIOGEL PI IND STRL 7.0 (GLOVE) IMPLANT
GLOVE BIOGEL PI IND STRL 8.5 (GLOVE) ×1 IMPLANT
GLOVE BIOGEL PI INDICATOR 7.0 (GLOVE) ×4
GLOVE BIOGEL PI INDICATOR 8.5 (GLOVE) ×2
GLOVE ECLIPSE 6.5 STRL STRAW (GLOVE) ×2 IMPLANT
GLOVE SURG ORTHO 8.0 STRL STRW (GLOVE) ×3 IMPLANT
GOWN STRL REUS W/ TWL LRG LVL3 (GOWN DISPOSABLE) ×1 IMPLANT
GOWN STRL REUS W/TWL LRG LVL3 (GOWN DISPOSABLE) ×3
GOWN STRL REUS W/TWL XL LVL3 (GOWN DISPOSABLE) ×3 IMPLANT
NDL PRECISIONGLIDE 27X1.5 (NEEDLE) IMPLANT
NEEDLE PRECISIONGLIDE 27X1.5 (NEEDLE) ×3 IMPLANT
NS IRRIG 1000ML POUR BTL (IV SOLUTION) ×3 IMPLANT
PACK BASIN DAY SURGERY FS (CUSTOM PROCEDURE TRAY) ×3 IMPLANT
STOCKINETTE 4X48 STRL (DRAPES) ×3 IMPLANT
SUT ETHILON 4 0 PS 2 18 (SUTURE) ×3 IMPLANT
SUT VICRYL 4-0 PS2 18IN ABS (SUTURE) IMPLANT
SYR BULB 3OZ (MISCELLANEOUS) ×3 IMPLANT
SYR CONTROL 10ML LL (SYRINGE) ×2 IMPLANT
TOWEL OR 17X24 6PK STRL BLUE (TOWEL DISPOSABLE) ×3 IMPLANT
UNDERPAD 30X30 INCONTINENT (UNDERPADS AND DIAPERS) ×3 IMPLANT

## 2014-03-27 NOTE — Op Note (Signed)
Dictation Number [id number]

## 2014-03-27 NOTE — Brief Op Note (Signed)
03/27/2014  12:00 PM  PATIENT:  Arnoldo Morale  64 y.o. male  PRE-OPERATIVE DIAGNOSIS:  LEFT CARPAL TUNNEL SYNDROME  POST-OPERATIVE DIAGNOSIS:  LEFT CARPAL TUNNEL SYNDROME  PROCEDURE:  Procedure(s) with comments: LEFT CARPAL TUNNEL RELEASE (Left) - ANESTHESIA:  GENERAL, IV REGIONAL/FAB  SURGEON:  Surgeon(s) and Role:    * Daryll Brod, MD - Primary  PHYSICIAN ASSISTANT:   ASSISTANTS: none   ANESTHESIA:   local and general  EBL:  Total I/O In: 800 [I.V.:800] Out: -   BLOOD ADMINISTERED:none  DRAINS: none   LOCAL MEDICATIONS USED:  BUPIVICAINE   SPECIMEN:  No Specimen  DISPOSITION OF SPECIMEN:  N/A  COUNTS:  YES  TOURNIQUET:   Total Tourniquet Time Documented: Upper Arm (Left) - 12 minutes Total: Upper Arm (Left) - 12 minutes   DICTATION: .Other Dictation: Dictation Number [id number]  PLAN OF CARE: Discharge to home after PACU  PATIENT DISPOSITION:  PACU - hemodynamically stable.

## 2014-03-27 NOTE — Discharge Instructions (Addendum)

## 2014-03-27 NOTE — Anesthesia Preprocedure Evaluation (Signed)
Anesthesia Evaluation  Patient identified by MRN, date of birth, ID band  Reviewed: Allergy & Precautions, NPO status , Patient's Chart, lab work & pertinent test results  Airway Mallampati: I  TM Distance: >3 FB Neck ROM: Full    Dental  (+) Teeth Intact, Dental Advisory Given   Pulmonary Current Smoker,  breath sounds clear to auscultation        Cardiovascular Rhythm:Regular Rate:Normal     Neuro/Psych    GI/Hepatic   Endo/Other  diabetes, Well Controlled, Type 2, Oral Hypoglycemic Agents  Renal/GU      Musculoskeletal   Abdominal   Peds  Hematology   Anesthesia Other Findings   Reproductive/Obstetrics                             Anesthesia Physical Anesthesia Plan  ASA: II  Anesthesia Plan: General   Post-op Pain Management:    Induction: Intravenous  Airway Management Planned: LMA  Additional Equipment:   Intra-op Plan:   Post-operative Plan: Extubation in OR  Informed Consent: I have reviewed the patients History and Physical, chart, labs and discussed the procedure including the risks, benefits and alternatives for the proposed anesthesia with the patient or authorized representative who has indicated his/her understanding and acceptance.   Dental advisory given  Plan Discussed with: CRNA, Anesthesiologist and Surgeon  Anesthesia Plan Comments:         Anesthesia Quick Evaluation

## 2014-03-27 NOTE — H&P (Signed)
Charles Sanders is a 64 year old right hand dominant male referred by Dr. Carrolyn Meiers for a consultation with respect to numbness and tingling in both hands, left greater than right. This has been going on for 2 months on a constant basis. He does not relate exactly how long he has had numbness and tingling in each hand. He has a history of diabetes. There is no history of thyroid problems, arthritis or gout. There is no history of injury to the hand or neck. He states it does not awaken him at night but he is up frequently because of prostate problems. He has had trigger fingers released in the past by Dr. Daylene Katayama. He has not had any treatment for this. He complains of intermittent, moderate severe, throbbing pain with numbness and weakness in the median nerve distribution. He says it is getting worse. Activity makes it worse and rest makes it better. Nerve conduction studies done by Dr. Zebedee Iba reveal a motor delay of 8.7 on the left and 11.5 on the right, sensory delay of 4.4 on the left and 4.9 on the right, amplitude diminution to 7 on each side.    PAST MEDICAL HISTORY:  He has no known drug allergies. He is on Januvia, HCTZ, and Vytorin. He has had prostate surgery, cholecystectomy, kidney stones.  FAMILY MEDICAL HISTORY: Positive for diabetes, heart disease, high BP and arthritis.  SOCIAL HISTORY:  He smokes 1  PPD advised to quit and the reasons behind this. He does not drink.  REVIEW OF SYSTEMS: Positive for glasses, otherwise negative 14 points.  Charles Sanders is an 64 y.o. male.   Chief Complaint: CTS left HPI: see above  Past Medical History  Diagnosis Date  . Diabetes mellitus without complication   . Kidney stones   . Wears glasses   . Cancer     prostate    Past Surgical History  Procedure Laterality Date  . Trigger finger release  12/14/2011    Procedure: MINOR RELEASE TRIGGER FINGER/A-1 PULLEY;  Surgeon: Cammie Sickle., MD;  Location: [facility];  Service: Orthopedics;  Laterality: Left;  ring  . Prostate surgery  2009    robotic-lap-prostatectomy  . Cholecystectomy    . Kidney stone surgery    . Hernia repair      x2  . Colonoscopy      Family History  Problem Relation Age of Onset  . Colon cancer Maternal Grandfather 69   Social History:  reports that he has been smoking Cigarettes.  He has been smoking about 1.00 pack per day. He has never used smokeless tobacco. He reports that he drinks about 0.5 oz of alcohol per week. He reports that he does not use illicit drugs.  Allergies: No Known Allergies  Medications Prior to Admission  Medication Sig Dispense Refill  . aspirin 325 MG EC tablet Take 325 mg by mouth daily. 1/2 at night    . ezetimibe-simvastatin (VYTORIN) 10-40 MG per tablet Take 1 tablet by mouth at bedtime.    Marland Kitchen glimepiride (AMARYL) 2 MG tablet Take 2 mg by mouth daily with breakfast.    . hydrochlorothiazide (MICROZIDE) 12.5 MG capsule Take 12.5 mg by mouth daily.    . Linagliptin-Metformin HCl (JENTADUETO) 2.05-998 MG TABS Take 1 tablet by mouth 2 (two) times daily.    . potassium citrate (UROCIT-K) 10 MEQ (1080 MG) SR tablet Take 10 mEq by mouth 3 (three) times daily with meals.    . Methylcellulose, Laxative, (  CITRUCEL PO) Take by mouth.      Results for orders placed or performed during the hospital encounter of 03/27/14 (from the past 48 hour(s))  Basic metabolic panel     Status: Abnormal   Collection Time: 03/26/14  2:15 PM  Result Value Ref Range   Sodium 139 135 - 145 mmol/L   Potassium 3.4 (L) 3.5 - 5.1 mmol/L   Chloride 97 96 - 112 mmol/L   CO2 36 (H) 19 - 32 mmol/L   Glucose, Bld 224 (H) 70 - 99 mg/dL   BUN 14 6 - 23 mg/dL   Creatinine, Ser 0.75 0.50 - 1.35 mg/dL   Calcium 9.4 8.4 - 10.5 mg/dL   GFR calc non Af Amer >90 >90 mL/min   GFR calc Af Amer >90 >90 mL/min    Comment: (NOTE) The eGFR has been calculated using the CKD EPI equation. This calculation has not been validated  in all clinical situations. eGFR's persistently <90 mL/min signify possible Chronic Kidney Disease.    Anion gap 6 5 - 15    No results found.   Pertinent items are noted in HPI.  Blood pressure 144/71, pulse 90, temperature 98.2 F (36.8 C), temperature source Oral, resp. rate 16, height $RemoveBe'5\' 8"'VSyeCELUb$  (1.727 m), weight 75.297 kg (166 lb), SpO2 98 %.  General appearance: alert, cooperative and appears stated age Head: Normocephalic, without obvious abnormality Neck: no JVD Resp: clear to auscultation bilaterally Cardio: regular rate and rhythm, S1, S2 normal, no murmur, click, rub or gallop GI: soft, non-tender; bowel sounds normal; no masses,  no organomegaly Extremities: numbness left hand Pulses: 2+ and symmetric Skin: Skin color, texture, turgor normal. No rashes or lesions Neurologic: Grossly normal Incision/Wound: na  Assessment/Plan IMPRESSION:    Bilateral carpal tunnel syndrome.    He would like to proceed to have this surgically released skipping any conservative treatment. Pre, peri and post op care are discussed along with risks and complications. Patient is aware there is no guarantee with surgery, possibility of infection, injury to arteries, nerves, and tendons, incomplete relief and dystrophy. He is scheduled for left carpal tunnel release at his request under regional anesthesia.  Edward Trevino R 03/27/2014, 10:19 AM

## 2014-03-27 NOTE — Anesthesia Postprocedure Evaluation (Signed)
  Anesthesia Post-op Note  Patient: Charles Sanders  Procedure(s) Performed: Procedure(s) with comments: LEFT CARPAL TUNNEL RELEASE (Left) - ANESTHESIA:  GENERAL, IV REGIONAL/FAB  Patient Location: PACU  Anesthesia Type: General   Level of Consciousness: awake, alert  and oriented  Airway and Oxygen Therapy: Patient Spontanous Breathing  Post-op Pain: mild  Post-op Assessment: Post-op Vital signs reviewed  Post-op Vital Signs: Reviewed  Last Vitals:  Filed Vitals:   03/27/14 1239  BP: 127/67  Pulse: 78  Temp: 36.4 C  Resp: 16    Complications: No apparent anesthesia complications

## 2014-03-27 NOTE — Op Note (Signed)
NAMEDEMETRIS, Charles Sanders NO.:  [id number]  MEDICAL RECORD NO.:  [id number]  LOCATION:                               FACILITY:  [facility]  PHYSICIAN:  Daryll Brod, M.D.       DATE OF BIRTH:  [birth year]  DATE OF PROCEDURE:  03/27/2014 DATE OF DISCHARGE:  03/27/2014                              OPERATIVE REPORT   PREOPERATIVE DIAGNOSIS:  Carpal tunnel syndrome, left hand.  POSTOPERATIVE DIAGNOSIS:  Carpal tunnel syndrome, left hand.  OPERATION:  Decompression, left median nerve.  SURGEON:  Daryll Brod, MD.  ANESTHESIA:  General with local infiltration.  ANESTHESIOLOGIST:  Lorrene Reid, M.D.  HISTORY:  The patient is a 64 year old male with a history of carpal tunnel syndrome, EMG nerve conduction is positive.  This has not responded to conservative treatment.  He has elected to undergo surgical release of median nerve.  Pre, peri, and postoperative course have been discussed along with risks and complications.  He is aware that there is no guarantee with the surgery; possibility of infection; recurrence of injury to arteries, nerves, tendons; incomplete relief of symptoms; and dystrophy.  In the preoperative area, the patient was seen, the extremity marked by both the patient and surgeon.  Antibiotic given.  DESCRIPTION OF PROCEDURE:  The patient was brought to the operating room where a general anesthetic was carried out without difficulty.  He was prepped using ChloraPrep in supine position with the left arm free.  A 3- minute dry time was allowed.  Time-out taken, confirming the patient and procedure.  The limb was exsanguinated with an Esmarch bandage. Tourniquet was placed high on the arm, was inflated to 250 mmHg.  A longitudinal incision was made in the left palm, carried down through subcutaneous tissue.  Bleeders were electrocauterized with bipolar.  The palmar fascia was split.  Superficial palmar arch identified.  Flexor tendon to the ring and little  finger identified to the ulnar side of median nerve.  The carpal retinaculum was incised with sharp dissection. Right angle and Sewall retractor were placed between the skin and forearm fascia.  Fascia was released for approximately 2 cm proximal to the wrist crease under direct vision.  Canal was explored.  Area of compression to the nerve was apparent.  Motor branch entered into muscle distally.  No further lesions were identified.  The wound was irrigated and the skin closed with interrupted 4-0 nylon sutures.  Local infiltration with 0.25% bupivacaine without epinephrine was given, approximately 10 mL was used.  A sterile compressive dressing with the fingers free was applied.  On deflation of the tourniquet, all fingers immediately pinked.  He was taken to the recovery room for observation in satisfactory condition.  He will be discharged home to return to the [facility] in 1 week.  He has Percocet to take at home.          ______________________________ Daryll Brod, M.D.     GK/MEDQ  D:  03/27/2014  T:  03/27/2014  Job:  [id number]

## 2014-03-27 NOTE — Transfer of Care (Signed)
Immediate Anesthesia Transfer of Care Note  Patient: Charles Sanders  Procedure(s) Performed: Procedure(s) with comments: LEFT CARPAL TUNNEL RELEASE (Left) - ANESTHESIA:  GENERAL, IV REGIONAL/FAB  Patient Location: PACU  Anesthesia Type:General  Level of Consciousness: sedated  Airway & Oxygen Therapy: Patient Spontanous Breathing and Patient connected to face mask oxygen  Post-op Assessment: Report given to RN and Post -op Vital signs reviewed and stable  Post vital signs: Reviewed and stable  Last Vitals:  Filed Vitals:   03/27/14 1155  BP:   Pulse: 77  Temp: 36.7 C  Resp: 23    Complications: No apparent anesthesia complications

## 2014-04-01 ENCOUNTER — Encounter (HOSPITAL_BASED_OUTPATIENT_CLINIC_OR_DEPARTMENT_OTHER): Payer: Self-pay | Admitting: Orthopedic Surgery

## 2014-04-10 ENCOUNTER — Other Ambulatory Visit: Payer: Self-pay | Admitting: Orthopedic Surgery

## 2014-04-28 ENCOUNTER — Encounter (HOSPITAL_BASED_OUTPATIENT_CLINIC_OR_DEPARTMENT_OTHER): Payer: Self-pay | Admitting: *Deleted

## 2014-04-28 NOTE — Progress Notes (Signed)
Here 3/16-did well-needs [employer]

## 2014-04-29 ENCOUNTER — Ambulatory Visit (HOSPITAL_BASED_OUTPATIENT_CLINIC_OR_DEPARTMENT_OTHER): Payer: Medicare Other | Admitting: Certified Registered"

## 2014-04-29 ENCOUNTER — Ambulatory Visit (HOSPITAL_BASED_OUTPATIENT_CLINIC_OR_DEPARTMENT_OTHER)
Admission: RE | Admit: 2014-04-29 | Discharge: 2014-04-29 | Disposition: A | Discharge status: Home / Self Care | Payer: Medicare Other | Source: Ambulatory Visit | Attending: Orthopedic Surgery | Admitting: Orthopedic Surgery

## 2014-04-29 ENCOUNTER — Encounter (HOSPITAL_BASED_OUTPATIENT_CLINIC_OR_DEPARTMENT_OTHER)
Admission: RE | Disposition: A | Discharge status: Home / Self Care | Payer: Self-pay | Source: Ambulatory Visit | Attending: Orthopedic Surgery

## 2014-04-29 ENCOUNTER — Encounter (HOSPITAL_BASED_OUTPATIENT_CLINIC_OR_DEPARTMENT_OTHER): Payer: Self-pay | Admitting: Orthopedic Surgery

## 2014-04-29 DIAGNOSIS — Z87442 Personal history of urinary calculi: Secondary | ICD-10-CM | POA: Diagnosis not present

## 2014-04-29 DIAGNOSIS — E119 Type 2 diabetes mellitus without complications: Secondary | ICD-10-CM | POA: Diagnosis not present

## 2014-04-29 DIAGNOSIS — F17210 Nicotine dependence, cigarettes, uncomplicated: Secondary | ICD-10-CM | POA: Diagnosis not present

## 2014-04-29 DIAGNOSIS — G5601 Carpal tunnel syndrome, right upper limb: Secondary | ICD-10-CM | POA: Diagnosis not present

## 2014-04-29 DIAGNOSIS — Z79899 Other long term (current) drug therapy: Secondary | ICD-10-CM | POA: Diagnosis not present

## 2014-04-29 DIAGNOSIS — Z9049 Acquired absence of other specified parts of digestive tract: Secondary | ICD-10-CM | POA: Insufficient documentation

## 2014-04-29 HISTORY — PX: CARPAL TUNNEL RELEASE: SHX101

## 2014-04-29 LAB — POCT I-STAT, CHEM 8
BUN: 12 mg/dL (ref 6–23)
CALCIUM ION: 1.15 mmol/L (ref 1.13–1.30)
CHLORIDE: 97 mmol/L (ref 96–112)
Creatinine, Ser: 0.6 mg/dL (ref 0.50–1.35)
Glucose, Bld: 188 mg/dL — ABNORMAL HIGH (ref 70–99)
HEMATOCRIT: 47 % (ref 39.0–52.0)
Hemoglobin: 16 g/dL (ref 13.0–17.0)
POTASSIUM: 3.5 mmol/L (ref 3.5–5.1)
Sodium: 138 mmol/L (ref 135–145)
TCO2: 27 mmol/L (ref 0–100)

## 2014-04-29 LAB — GLUCOSE, CAPILLARY: Glucose-Capillary: 158 mg/dL — ABNORMAL HIGH (ref 70–99)

## 2014-04-29 SURGERY — CARPAL TUNNEL RELEASE
Anesthesia: General | Site: Wrist | Laterality: Right

## 2014-04-29 MED ORDER — CEFAZOLIN SODIUM-DEXTROSE 2-3 GM-% IV SOLR: 2.0000 g | INTRAVENOUS | Status: DC

## 2014-04-29 MED ORDER — PROPOFOL 10 MG/ML IV BOLUS
INTRAVENOUS | Status: DC | PRN
Start: 2014-04-29 — End: 2014-04-29
  Administered 2014-04-29: 200 mg via INTRAVENOUS

## 2014-04-29 MED ORDER — FENTANYL CITRATE (PF) 100 MCG/2ML IJ SOLN: 50.0000 ug | INTRAMUSCULAR | Status: DC | PRN

## 2014-04-29 MED ORDER — CEFAZOLIN SODIUM-DEXTROSE 2-3 GM-% IV SOLR
INTRAVENOUS | Status: AC
  Filled 2014-04-29: qty 50

## 2014-04-29 MED ORDER — ONDANSETRON HCL 4 MG/2ML IJ SOLN
INTRAMUSCULAR | Status: DC | PRN
  Administered 2014-04-29: 4 mg via INTRAVENOUS

## 2014-04-29 MED ORDER — PROMETHAZINE HCL 25 MG/ML IJ SOLN: 6.2500 mg | INTRAMUSCULAR | Status: DC | PRN

## 2014-04-29 MED ORDER — OXYCODONE HCL 5 MG/5ML PO SOLN: 5.0000 mg | Freq: Once | ORAL | Status: DC | PRN

## 2014-04-29 MED ORDER — GLYCOPYRROLATE 0.2 MG/ML IJ SOLN: 0.2000 mg | Freq: Once | INTRAMUSCULAR | Status: DC | PRN

## 2014-04-29 MED ORDER — CHLORHEXIDINE GLUCONATE 4 % EX LIQD: 60.0000 mL | Freq: Once | CUTANEOUS | Status: DC

## 2014-04-29 MED ORDER — BUPIVACAINE HCL (PF) 0.25 % IJ SOLN
INTRAMUSCULAR | Status: AC
  Filled 2014-04-29: qty 30

## 2014-04-29 MED ORDER — DEXAMETHASONE SODIUM PHOSPHATE 10 MG/ML IJ SOLN
INTRAMUSCULAR | Status: DC | PRN
  Administered 2014-04-29: 10 mg via INTRAVENOUS

## 2014-04-29 MED ORDER — HYDROCODONE-ACETAMINOPHEN 5-325 MG PO TABS: 1.0000 | ORAL_TABLET | Freq: Four times a day (QID) | ORAL | Status: DC | PRN

## 2014-04-29 MED ORDER — FENTANYL CITRATE (PF) 100 MCG/2ML IJ SOLN
INTRAMUSCULAR | Status: DC | PRN
  Administered 2014-04-29 (×2): 50 ug via INTRAVENOUS

## 2014-04-29 MED ORDER — LIDOCAINE HCL (CARDIAC) 20 MG/ML IV SOLN
INTRAVENOUS | Status: DC | PRN
  Administered 2014-04-29: 60 mg via INTRAVENOUS

## 2014-04-29 MED ORDER — LACTATED RINGERS IV SOLN
INTRAVENOUS | Status: DC
  Administered 2014-04-29 (×2): via INTRAVENOUS

## 2014-04-29 MED ORDER — LIDOCAINE HCL (PF) 1 % IJ SOLN
INTRAMUSCULAR | Status: AC
  Filled 2014-04-29: qty 30

## 2014-04-29 MED ORDER — FENTANYL CITRATE (PF) 100 MCG/2ML IJ SOLN
INTRAMUSCULAR | Status: AC
  Filled 2014-04-29: qty 4

## 2014-04-29 MED ORDER — BUPIVACAINE HCL (PF) 0.25 % IJ SOLN
INTRAMUSCULAR | Status: DC | PRN
  Administered 2014-04-29: 10 mL

## 2014-04-29 MED ORDER — 0.9 % SODIUM CHLORIDE (POUR BTL) OPTIME
TOPICAL | Status: DC | PRN
  Administered 2014-04-29: 200 mL

## 2014-04-29 MED ORDER — OXYCODONE HCL 5 MG PO TABS: 5.0000 mg | ORAL_TABLET | Freq: Once | ORAL | Status: DC | PRN

## 2014-04-29 MED ORDER — MIDAZOLAM HCL 2 MG/2ML IJ SOLN: 1.0000 mg | INTRAMUSCULAR | Status: DC | PRN

## 2014-04-29 MED ORDER — HYDROMORPHONE HCL 1 MG/ML IJ SOLN: 0.2500 mg | INTRAMUSCULAR | Status: DC | PRN

## 2014-04-29 MED ORDER — CEFAZOLIN SODIUM-DEXTROSE 2-3 GM-% IV SOLR
2.0000 g | INTRAVENOUS | Status: AC
  Administered 2014-04-29: 2 g via INTRAVENOUS

## 2014-04-29 SURGICAL SUPPLY — 38 items
BLADE SURG 15 STRL LF DISP TIS (BLADE) ×1 IMPLANT
BLADE SURG 15 STRL SS (BLADE) ×3
BNDG CMPR 9X4 STRL LF SNTH (GAUZE/BANDAGES/DRESSINGS) ×1
BNDG COHESIVE 3X5 TAN STRL LF (GAUZE/BANDAGES/DRESSINGS) ×3 IMPLANT
BNDG ESMARK 4X9 LF (GAUZE/BANDAGES/DRESSINGS) ×2 IMPLANT
BNDG GAUZE ELAST 4 BULKY (GAUZE/BANDAGES/DRESSINGS) ×3 IMPLANT
CHLORAPREP W/TINT 26ML (MISCELLANEOUS) ×3 IMPLANT
CORDS BIPOLAR (ELECTRODE) ×3 IMPLANT
COVER BACK TABLE 60X90IN (DRAPES) ×3 IMPLANT
COVER MAYO STAND STRL (DRAPES) ×3 IMPLANT
CUFF TOURNIQUET SINGLE 18IN (TOURNIQUET CUFF) ×3 IMPLANT
DRAPE EXTREMITY T 121X128X90 (DRAPE) ×3 IMPLANT
DRAPE SURG 17X23 STRL (DRAPES) ×3 IMPLANT
DRSG PAD ABDOMINAL 8X10 ST (GAUZE/BANDAGES/DRESSINGS) ×3 IMPLANT
GAUZE SPONGE 4X4 12PLY STRL (GAUZE/BANDAGES/DRESSINGS) ×3 IMPLANT
GAUZE XEROFORM 1X8 LF (GAUZE/BANDAGES/DRESSINGS) ×3 IMPLANT
GLOVE BIO SURGEON STRL SZ 6.5 (GLOVE) ×1 IMPLANT
GLOVE BIO SURGEONS STRL SZ 6.5 (GLOVE) ×1
GLOVE BIOGEL PI IND STRL 7.0 (GLOVE) IMPLANT
GLOVE BIOGEL PI IND STRL 8.5 (GLOVE) ×1 IMPLANT
GLOVE BIOGEL PI INDICATOR 7.0 (GLOVE) ×2
GLOVE BIOGEL PI INDICATOR 8.5 (GLOVE) ×2
GLOVE EXAM NITRILE LRG STRL (GLOVE) ×2 IMPLANT
GLOVE SURG ORTHO 8.0 STRL STRW (GLOVE) ×3 IMPLANT
GOWN STRL REUS W/ TWL LRG LVL3 (GOWN DISPOSABLE) ×1 IMPLANT
GOWN STRL REUS W/TWL LRG LVL3 (GOWN DISPOSABLE) ×3
GOWN STRL REUS W/TWL XL LVL3 (GOWN DISPOSABLE) ×3 IMPLANT
NDL PRECISIONGLIDE 27X1.5 (NEEDLE) IMPLANT
NEEDLE PRECISIONGLIDE 27X1.5 (NEEDLE) ×3 IMPLANT
NS IRRIG 1000ML POUR BTL (IV SOLUTION) ×3 IMPLANT
PACK BASIN DAY SURGERY FS (CUSTOM PROCEDURE TRAY) ×3 IMPLANT
STOCKINETTE 4X48 STRL (DRAPES) ×3 IMPLANT
SUT ETHILON 4 0 PS 2 18 (SUTURE) ×3 IMPLANT
SUT VICRYL 4-0 PS2 18IN ABS (SUTURE) IMPLANT
SYR BULB 3OZ (MISCELLANEOUS) ×3 IMPLANT
SYR CONTROL 10ML LL (SYRINGE) ×2 IMPLANT
TOWEL OR 17X24 6PK STRL BLUE (TOWEL DISPOSABLE) ×3 IMPLANT
UNDERPAD 30X30 INCONTINENT (UNDERPADS AND DIAPERS) ×3 IMPLANT

## 2014-04-29 NOTE — Op Note (Signed)
Dictation Number [id number]

## 2014-04-29 NOTE — Anesthesia Postprocedure Evaluation (Signed)
  Anesthesia Post-op Note  Patient: Charles Sanders  Procedure(s) Performed: Procedure(s): RIGHT CARPAL TUNNEL RELEASE (Right)  Patient Location: PACU  Anesthesia Type:General  Level of Consciousness: awake and alert   Airway and Oxygen Therapy: Patient Spontanous Breathing  Post-op Pain: none  Post-op Assessment: Post-op Vital signs reviewed  Post-op Vital Signs: Reviewed  Last Vitals:  Filed Vitals:   04/29/14 0940  BP: 123/77  Pulse: 79  Temp: 36.4 C  Resp: 16    Complications: No apparent anesthesia complications

## 2014-04-29 NOTE — Anesthesia Preprocedure Evaluation (Addendum)
Anesthesia Evaluation  Patient identified by MRN, date of birth, ID band  Reviewed: Allergy & Precautions, NPO status , Patient's Chart, lab work & pertinent test results  Airway Mallampati: I  TM Distance: >3 FB Neck ROM: Full    Dental  (+) Teeth Intact, Dental Advisory Given   Pulmonary Current Smoker,  breath sounds clear to auscultation        Cardiovascular Rhythm:Regular Rate:Normal     Neuro/Psych    GI/Hepatic   Endo/Other  diabetes, Well Controlled, Type 2, Oral Hypoglycemic Agents  Renal/GU      Musculoskeletal   Abdominal   Peds  Hematology   Anesthesia Other Findings   Reproductive/Obstetrics                            Anesthesia Physical  Anesthesia Plan  ASA: II  Anesthesia Plan: General   Post-op Pain Management:    Induction: Intravenous  Airway Management Planned: LMA  Additional Equipment:   Intra-op Plan:   Post-operative Plan: Extubation in OR  Informed Consent: I have reviewed the patients History and Physical, chart, labs and discussed the procedure including the risks, benefits and alternatives for the proposed anesthesia with the patient or authorized representative who has indicated his/her understanding and acceptance.   Dental advisory given  Plan Discussed with: CRNA, Anesthesiologist and Surgeon  Anesthesia Plan Comments:         Anesthesia Quick Evaluation

## 2014-04-29 NOTE — Discharge Instructions (Addendum)

## 2014-04-29 NOTE — Brief Op Note (Signed)
04/29/2014  9:15 AM  PATIENT:  Charles Sanders  64 y.o. male  PRE-OPERATIVE DIAGNOSIS:  Right carpal tunnel syndrome G56.01  POST-OPERATIVE DIAGNOSIS:  Right carpal tunnel syndrome G56.01  PROCEDURE:  Procedure(s): RIGHT CARPAL TUNNEL RELEASE (Right)  SURGEON:  Surgeon(s) and Role:    * Daryll Brod, MD - Primary  PHYSICIAN ASSISTANT:   ASSISTANTS: none   ANESTHESIA:   local and general  EBL:  Total I/O In: 1000 [I.V.:1000] Out: -   BLOOD ADMINISTERED:none  DRAINS: none   LOCAL MEDICATIONS USED:  BUPIVICAINE   SPECIMEN:  No Specimen  DISPOSITION OF SPECIMEN:  N/A  COUNTS:  YES  TOURNIQUET:   Total Tourniquet Time Documented: Upper Arm (Right) - 13 minutes Total: Upper Arm (Right) - 13 minutes   DICTATION: .Other Dictation: Dictation Number [id number]  PLAN OF CARE: Discharge to home after PACU  PATIENT DISPOSITION:  PACU - hemodynamically stable.

## 2014-04-29 NOTE — Transfer of Care (Signed)
Immediate Anesthesia Transfer of Care Note  Patient: Charles Sanders  Procedure(s) Performed: Procedure(s): RIGHT CARPAL TUNNEL RELEASE (Right)  Patient Location: PACU  Anesthesia Type:General  Level of Consciousness: awake and patient cooperative  Airway & Oxygen Therapy: Patient Spontanous Breathing and Patient connected to face mask oxygen  Post-op Assessment: Report given to RN and Post -op Vital signs reviewed and stable  Post vital signs: Reviewed and stable  Last Vitals:  Filed Vitals:   04/29/14 0747  BP: 130/70  Pulse: 92  Temp: 36.7 C  Resp: 20    Complications: No apparent anesthesia complications

## 2014-04-29 NOTE — H&P (Signed)
Charles Sanders is a 64 year old right hand dominant male referred by Dr. Carrolyn Meiers for a consultation with respect to numbness and tingling in both hands, left greater than right. This has been going on for 2 months on a constant basis. He does not relate exactly how long he has had numbness and tingling in each hand. He has a history of diabetes. There is no history of thyroid problems, arthritis or gout. There is no history of injury to the hand or neck. He states it does not awaken him at night but he is up frequently because of prostate problems. He has had trigger fingers released in the past by Dr. Daylene Katayama. He has not had any treatment for this. He complains of intermittent, moderate severe, throbbing pain with numbness and weakness in the median nerve distribution. He says it is getting worse. Activity makes it worse and rest makes it better.He has had nerve conduction studies done by Dr. Zebedee Iba. This reveals a motor delay of 8.7 on the left and 11.5 on the right, sensory delay of 4.4 on the left and 4.9 on the right, amplitude diminution to 7 on each side.    PAST MEDICAL HISTORY:  He has no known drug allergies. He is on Januvia, HCTZ, and Vytorin. He has had prostate surgery, cholecystectomy, kidney stones.  FAMILY MEDICAL HISTORY: Positive for diabetes, heart disease, high BP and arthritis.  SOCIAL HISTORY:  He smokes 1  PPD advised to quit and the reasons behind this. He does not drink.  REVIEW OF SYSTEMS: Positive for glasses, otherwise negative 14 points.  Charles Sanders is an 64 y.o. male.   Chief Complaint: CTS right HPI: see above  Past Medical History  Diagnosis Date  . Diabetes mellitus without complication   . Kidney stones   . Wears glasses   . Cancer     prostate    Past Surgical History  Procedure Laterality Date  . Trigger finger release  12/14/2011    Procedure: MINOR RELEASE TRIGGER FINGER/A-1 PULLEY;  Surgeon: Cammie Sickle., MD;  Location: [facility];  Service: Orthopedics;  Laterality: Left;  ring  . Prostate surgery  2009    robotic-lap-prostatectomy  . Cholecystectomy    . Kidney stone surgery    . Hernia repair      x2  . Colonoscopy    . Carpal tunnel release Left 03/27/2014    Procedure: LEFT CARPAL TUNNEL RELEASE;  Surgeon: Daryll Brod, MD;  Location: [facility];  Service: Orthopedics;  Laterality: Left;  ANESTHESIA:  GENERAL, IV REGIONAL/FAB    Family History  Problem Relation Age of Onset  . Colon cancer Maternal Grandfather 51   Social History:  reports that he has been smoking Cigarettes.  He has been smoking about 1.00 pack per day. He has never used smokeless tobacco. He reports that he drinks about 0.5 oz of alcohol per week. He reports that he does not use illicit drugs.  Allergies: No Known Allergies  No prescriptions prior to admission    No results found for this or any previous visit (from the past 48 hour(s)).  No results found.   Pertinent items are noted in HPI.  Height 5\' 8"  (1.727 m), weight 75.297 kg (166 lb).  General appearance: alert, cooperative and appears stated age Head: Normocephalic, without obvious abnormality Neck: no JVD Resp: clear to auscultation bilaterally Cardio: regular rate and rhythm, S1, S2 normal, no murmur, click, rub or gallop GI:  soft, non-tender; bowel sounds normal; no masses,  no organomegaly Extremities: numbness right hand Pulses: 2+ and symmetric Skin: Skin color, texture, turgor normal. No rashes or lesions Neurologic: Grossly normal Incision/Wound: na  Assessment/Plan [name] would also like to proceed with right carpal tunnel release as he has well documented severe right carpal tunnel syndrome by electrodiagnostic studies. Pre, peri and post op care are discussed along with risks and complications. He is in agreement with this plan. He will therefore be scheduled for right carpal tunnel release.  Imagene Boss  R 04/29/2014, 5:31 AM

## 2014-04-29 NOTE — Anesthesia Procedure Notes (Signed)
Procedure Name: LMA Insertion Date/Time: 04/29/2014 8:42 AM Performed by: Ommie Degeorge D Pre-anesthesia Checklist: Patient identified, Emergency Drugs available, Suction available and Patient being monitored Patient Re-evaluated:Patient Re-evaluated prior to inductionOxygen Delivery Method: Circle System Utilized Preoxygenation: Pre-oxygenation with 100% oxygen Intubation Type: IV induction Ventilation: Mask ventilation without difficulty LMA: LMA inserted LMA Size: 4.0 Number of attempts: 1 Airway Equipment and Method: Bite block Placement Confirmation: positive ETCO2 Tube secured with: Tape Dental Injury: Teeth and Oropharynx as per pre-operative assessment

## 2014-04-30 ENCOUNTER — Encounter (HOSPITAL_BASED_OUTPATIENT_CLINIC_OR_DEPARTMENT_OTHER): Payer: Self-pay | Admitting: Orthopedic Surgery

## 2014-04-30 NOTE — Op Note (Signed)
Charles Sanders, [name]:  [id number]  MEDICAL RECORD NO.:  [id number]  LOCATION:                                 FACILITY:  PHYSICIAN:  Daryll Brod, M.D.            DATE OF BIRTH:  DATE OF PROCEDURE:  04/29/2014 DATE OF DISCHARGE:                              OPERATIVE REPORT   PREOPERATIVE DIAGNOSIS:  Carpal tunnel syndrome, right hand.  POSTOPERATIVE DIAGNOSIS:  Carpal tunnel syndrome, right hand.  OPERATION:  Decompression of right median nerve.  SURGEON:  Daryll Brod, M.D.  ANESTHESIA:  General with local infiltration.  ANESTHESIOLOGIST:  Glynda Jaeger, M.D.  HISTORY:  The patient is a 64 year old male with a history of carpal tunnel syndrome bilaterally.  She has undergone release of the left side.  He is admitted now for release of the right.  Nerve conduction is positive, and this is not responded to conservative treatment.  He is aware that there is no guarantee with surgery; possibility of infection; recurrence; injury to arteries, nerves, tendons; incomplete relief of symptoms; dystrophy.  In preoperative area, the patient is seen, the extremity was marked by both the patient and surgeon.  Antibiotic given.  PROCEDURE IN DETAIL:  The patient was brought to the operating room, where a general anesthetic was carried out without difficulty.  He was prepped using ChloraPrep, supine position, right arm free.  A 3-minute dry time was allowed.  Time-out taken confirming the patient and procedure.  The limb was exsanguinated with an Esmarch bandage. Tourniquet placed high on the arm and was inflated to 250 mmHg.  A longitudinal incision was made in the right palm, carried down through subcutaneous tissue.  Bleeders were electrocauterized.  Palmar fascia was split.  Superficial palmar arch identified.  Flexor tendon of the ring and little finger identified to the ulnar side of median nerve. Carpal retinaculum was incised with sharp dissection.   Right angle and Sewall retractors were placed between skin and forearm fascia.  The fascia released for approximately 1.5 cm to 2 cm under direct vision. Canal was explored.  Air compression to the nerve.  An hourglass deformity was noted.  The motor branch entered into the muscle.  No further lesions were identified.  The wound was copiously irrigated with saline.  The skin was closed with interrupted 4-0 nylon sutures.  A local infiltration with 0.25% bupivacaine without epinephrine was given, approximately 8 mL to 9 mL was used.  A sterile compressive dressing with the fingers free was applied.  On deflation of the tourniquet, all fingers immediately pinked.  He was taken to the recovery room for observation in satisfactory condition. She will be discharged home to return to the [facility] in 1 week on Norco.          ______________________________ Daryll Brod, M.D.     GK/MEDQ  D:  04/29/2014  T:  04/29/2014  Job:  [id number]

## 2014-04-30 NOTE — Addendum Note (Signed)
Addendum  created 04/30/14 8333 by Ernesta Amble Hallie Ishida, CRNA   Modules edited: Anesthesia Events

## 2015-01-12 DIAGNOSIS — E119 Type 2 diabetes mellitus without complications: Secondary | ICD-10-CM | POA: Diagnosis not present

## 2015-01-12 DIAGNOSIS — Z6823 Body mass index (BMI) 23.0-23.9, adult: Secondary | ICD-10-CM | POA: Diagnosis not present

## 2015-01-12 DIAGNOSIS — R0989 Other specified symptoms and signs involving the circulatory and respiratory systems: Secondary | ICD-10-CM | POA: Diagnosis not present

## 2015-01-12 DIAGNOSIS — N200 Calculus of kidney: Secondary | ICD-10-CM | POA: Diagnosis not present

## 2015-01-12 DIAGNOSIS — Z720 Tobacco use: Secondary | ICD-10-CM | POA: Diagnosis not present

## 2015-01-12 DIAGNOSIS — J209 Acute bronchitis, unspecified: Secondary | ICD-10-CM | POA: Diagnosis not present

## 2015-01-12 DIAGNOSIS — I959 Hypotension, unspecified: Secondary | ICD-10-CM | POA: Diagnosis not present

## 2015-02-02 DIAGNOSIS — E119 Type 2 diabetes mellitus without complications: Secondary | ICD-10-CM | POA: Diagnosis not present

## 2015-02-02 DIAGNOSIS — M65311 Trigger thumb, right thumb: Secondary | ICD-10-CM | POA: Diagnosis not present

## 2015-04-21 DIAGNOSIS — Z6824 Body mass index (BMI) 24.0-24.9, adult: Secondary | ICD-10-CM | POA: Diagnosis not present

## 2015-04-21 DIAGNOSIS — Z1389 Encounter for screening for other disorder: Secondary | ICD-10-CM | POA: Diagnosis not present

## 2015-04-21 DIAGNOSIS — E119 Type 2 diabetes mellitus without complications: Secondary | ICD-10-CM | POA: Diagnosis not present

## 2015-05-07 DIAGNOSIS — H2511 Age-related nuclear cataract, right eye: Secondary | ICD-10-CM | POA: Diagnosis not present

## 2015-05-07 DIAGNOSIS — H25011 Cortical age-related cataract, right eye: Secondary | ICD-10-CM | POA: Diagnosis not present

## 2015-05-07 DIAGNOSIS — H25012 Cortical age-related cataract, left eye: Secondary | ICD-10-CM | POA: Diagnosis not present

## 2015-05-07 DIAGNOSIS — H25042 Posterior subcapsular polar age-related cataract, left eye: Secondary | ICD-10-CM | POA: Diagnosis not present

## 2015-05-26 DIAGNOSIS — H2511 Age-related nuclear cataract, right eye: Secondary | ICD-10-CM | POA: Diagnosis not present

## 2015-06-17 DIAGNOSIS — H25012 Cortical age-related cataract, left eye: Secondary | ICD-10-CM | POA: Diagnosis not present

## 2015-06-17 DIAGNOSIS — H2512 Age-related nuclear cataract, left eye: Secondary | ICD-10-CM | POA: Diagnosis not present

## 2015-06-23 DIAGNOSIS — H25812 Combined forms of age-related cataract, left eye: Secondary | ICD-10-CM | POA: Diagnosis not present

## 2015-06-23 DIAGNOSIS — H2512 Age-related nuclear cataract, left eye: Secondary | ICD-10-CM | POA: Diagnosis not present

## 2015-07-17 ENCOUNTER — Ambulatory Visit (INDEPENDENT_AMBULATORY_CARE_PROVIDER_SITE_OTHER): Payer: PPO | Admitting: Licensed Clinical Social Worker

## 2015-07-17 DIAGNOSIS — F3481 Disruptive mood dysregulation disorder: Secondary | ICD-10-CM

## 2015-07-31 ENCOUNTER — Ambulatory Visit (INDEPENDENT_AMBULATORY_CARE_PROVIDER_SITE_OTHER): Payer: PPO | Admitting: Licensed Clinical Social Worker

## 2015-07-31 DIAGNOSIS — F4320 Adjustment disorder, unspecified: Secondary | ICD-10-CM | POA: Diagnosis not present

## 2015-08-05 DIAGNOSIS — E1151 Type 2 diabetes mellitus with diabetic peripheral angiopathy without gangrene: Secondary | ICD-10-CM | POA: Diagnosis not present

## 2015-08-05 DIAGNOSIS — E784 Other hyperlipidemia: Secondary | ICD-10-CM | POA: Diagnosis not present

## 2015-08-05 DIAGNOSIS — K635 Polyp of colon: Secondary | ICD-10-CM | POA: Diagnosis not present

## 2015-08-05 DIAGNOSIS — C61 Malignant neoplasm of prostate: Secondary | ICD-10-CM | POA: Diagnosis not present

## 2015-08-05 DIAGNOSIS — Z6823 Body mass index (BMI) 23.0-23.9, adult: Secondary | ICD-10-CM | POA: Diagnosis not present

## 2015-08-05 DIAGNOSIS — Z720 Tobacco use: Secondary | ICD-10-CM | POA: Diagnosis not present

## 2015-08-05 DIAGNOSIS — I7389 Other specified peripheral vascular diseases: Secondary | ICD-10-CM | POA: Diagnosis not present

## 2015-08-05 DIAGNOSIS — D751 Secondary polycythemia: Secondary | ICD-10-CM | POA: Diagnosis not present

## 2015-08-05 DIAGNOSIS — N200 Calculus of kidney: Secondary | ICD-10-CM | POA: Diagnosis not present

## 2015-08-10 DIAGNOSIS — Z961 Presence of intraocular lens: Secondary | ICD-10-CM | POA: Diagnosis not present

## 2015-08-21 ENCOUNTER — Ambulatory Visit: Payer: PPO | Admitting: Licensed Clinical Social Worker

## 2015-10-03 DIAGNOSIS — Z23 Encounter for immunization: Secondary | ICD-10-CM | POA: Diagnosis not present

## 2015-11-20 DIAGNOSIS — N5201 Erectile dysfunction due to arterial insufficiency: Secondary | ICD-10-CM | POA: Diagnosis not present

## 2015-11-20 DIAGNOSIS — Z8546 Personal history of malignant neoplasm of prostate: Secondary | ICD-10-CM | POA: Diagnosis not present

## 2015-11-20 DIAGNOSIS — Z8639 Personal history of other endocrine, nutritional and metabolic disease: Secondary | ICD-10-CM | POA: Diagnosis not present

## 2015-12-08 DIAGNOSIS — E1151 Type 2 diabetes mellitus with diabetic peripheral angiopathy without gangrene: Secondary | ICD-10-CM | POA: Diagnosis not present

## 2015-12-08 DIAGNOSIS — I7389 Other specified peripheral vascular diseases: Secondary | ICD-10-CM | POA: Diagnosis not present

## 2015-12-08 DIAGNOSIS — C61 Malignant neoplasm of prostate: Secondary | ICD-10-CM | POA: Diagnosis not present

## 2015-12-08 DIAGNOSIS — K635 Polyp of colon: Secondary | ICD-10-CM | POA: Diagnosis not present

## 2015-12-08 DIAGNOSIS — E784 Other hyperlipidemia: Secondary | ICD-10-CM | POA: Diagnosis not present

## 2015-12-08 DIAGNOSIS — D751 Secondary polycythemia: Secondary | ICD-10-CM | POA: Diagnosis not present

## 2015-12-08 DIAGNOSIS — Z6823 Body mass index (BMI) 23.0-23.9, adult: Secondary | ICD-10-CM | POA: Diagnosis not present

## 2015-12-08 DIAGNOSIS — R05 Cough: Secondary | ICD-10-CM | POA: Diagnosis not present

## 2015-12-08 DIAGNOSIS — Z720 Tobacco use: Secondary | ICD-10-CM | POA: Diagnosis not present

## 2015-12-10 DIAGNOSIS — H40013 Open angle with borderline findings, low risk, bilateral: Secondary | ICD-10-CM | POA: Diagnosis not present

## 2015-12-10 DIAGNOSIS — H1851 Endothelial corneal dystrophy: Secondary | ICD-10-CM | POA: Diagnosis not present

## 2015-12-10 DIAGNOSIS — H01003 Unspecified blepharitis right eye, unspecified eyelid: Secondary | ICD-10-CM | POA: Diagnosis not present

## 2015-12-10 DIAGNOSIS — H02833 Dermatochalasis of right eye, unspecified eyelid: Secondary | ICD-10-CM | POA: Diagnosis not present

## 2015-12-15 ENCOUNTER — Other Ambulatory Visit: Payer: Self-pay | Admitting: *Deleted

## 2015-12-15 DIAGNOSIS — I739 Peripheral vascular disease, unspecified: Secondary | ICD-10-CM

## 2015-12-24 DIAGNOSIS — H6121 Impacted cerumen, right ear: Secondary | ICD-10-CM | POA: Diagnosis not present

## 2015-12-24 DIAGNOSIS — Z6824 Body mass index (BMI) 24.0-24.9, adult: Secondary | ICD-10-CM | POA: Diagnosis not present

## 2016-01-18 DIAGNOSIS — H26491 Other secondary cataract, right eye: Secondary | ICD-10-CM | POA: Diagnosis not present

## 2016-01-18 DIAGNOSIS — Z961 Presence of intraocular lens: Secondary | ICD-10-CM | POA: Diagnosis not present

## 2016-01-18 DIAGNOSIS — H40013 Open angle with borderline findings, low risk, bilateral: Secondary | ICD-10-CM | POA: Diagnosis not present

## 2016-01-18 DIAGNOSIS — E119 Type 2 diabetes mellitus without complications: Secondary | ICD-10-CM | POA: Diagnosis not present

## 2016-01-26 ENCOUNTER — Encounter: Payer: Self-pay | Admitting: Vascular Surgery

## 2016-02-02 ENCOUNTER — Encounter: Payer: Self-pay | Admitting: Vascular Surgery

## 2016-02-02 ENCOUNTER — Ambulatory Visit (HOSPITAL_COMMUNITY)
Admission: RE | Admit: 2016-02-02 | Discharge: 2016-02-02 | Disposition: A | Discharge status: Home / Self Care | Payer: PPO | Source: Ambulatory Visit | Attending: Vascular Surgery | Admitting: Vascular Surgery

## 2016-02-02 ENCOUNTER — Ambulatory Visit (INDEPENDENT_AMBULATORY_CARE_PROVIDER_SITE_OTHER): Payer: PPO | Admitting: Vascular Surgery

## 2016-02-02 VITALS — BP 113/74 | HR 97 | Temp 98.2°F | Resp 16 | Ht 68.0 in | Wt 158.0 lb

## 2016-02-02 DIAGNOSIS — I739 Peripheral vascular disease, unspecified: Secondary | ICD-10-CM

## 2016-02-02 DIAGNOSIS — M25562 Pain in left knee: Secondary | ICD-10-CM | POA: Diagnosis not present

## 2016-02-02 NOTE — Progress Notes (Signed)
[facility] [place]  Patient name: Charles Sanders MRN: [id number] DOB: [birth year] Sex: male  REASON FOR CONSULT: Evaluation of left leg pain  HPI: Charles Sanders is a 66 y.o. male, who is seen today for evaluation of left leg pain. He reports that this is left leg only. This can occur in his left calf and knee and extend up into his lateral thigh and hip area. Sometimes this is related to walking but can equally be noted when he is sitting or standing without walking. He does not have any history of arterial rest pain or lower extremity nonhealing ulceration or tissue loss. Not have any similar symptoms on the right leg. Does not have any history of any back pain or dinner degenerative disc disease. He is diabetic  Past Medical History:  Diagnosis Date  . Cancer [facility])    prostate  . Diabetes mellitus without complication ([place])   . Kidney stones   . Wears glasses     Family History  Problem Relation Age of Onset  . Colon cancer Maternal Grandfather 97    SOCIAL HISTORY: Social History   Social History  . Marital status: Married    Spouse name: N/A  . Number of children: N/A  . Years of education: N/A   Occupational History  . Not on file.   Social History Main Topics  . Smoking status: Current Every Day Smoker    Packs/day: 1.00    Types: Cigarettes  . Smokeless tobacco: Never Used     Comment: 1.5 pks per day  . Alcohol use 0.5 oz/week    1 Standard drinks or equivalent per week  . Drug use: No  . Sexual activity: Not on file   Other Topics Concern  . Not on file   Social History Narrative  . No narrative on file    No Known Allergies  Current Outpatient Prescriptions  Medication Sig Dispense Refill  . aspirin 325 MG EC tablet Take 325 mg by mouth daily. 1/2 at night    . Empagliflozin-Metformin HCl (SYNJARDY) 12.05-998 MG TABS Take by mouth 2 (two) times daily.    Marland Kitchen ezetimibe-simvastatin  (VYTORIN) 10-40 MG per tablet Take 1 tablet by mouth at bedtime.    Marland Kitchen glimepiride (AMARYL) 2 MG tablet Take 2 mg by mouth daily with breakfast.    . hydrochlorothiazide (MICROZIDE) 12.5 MG capsule Take 12.5 mg by mouth daily.    Marland Kitchen HYDROcodone-acetaminophen (NORCO) 5-325 MG per tablet Take 1 tablet by mouth every 6 (six) hours as needed for moderate pain. 30 tablet 0  . Methylcellulose, Laxative, (CITRUCEL PO) Take by mouth.    . potassium citrate (UROCIT-K) 10 MEQ (1080 MG) SR tablet Take 10 mEq by mouth 3 (three) times daily with meals.    Marland Kitchen zolpidem (AMBIEN) 10 MG tablet Take 10 mg by mouth at bedtime as needed for sleep.     No current facility-administered medications for this visit.     REVIEW OF SYSTEMS:  [X]  denotes positive finding, [ ]  denotes negative finding Cardiac  Comments:  Chest pain or chest pressure:    Shortness of breath upon exertion:    Short of breath when lying flat:    Irregular heart rhythm:        Vascular    Pain in calf, thigh, or hip brought on by ambulation: x   Pain in feet at night that wakes you up from your sleep:  x   Blood  clot in your veins:    Leg swelling:         Pulmonary    Oxygen at home:    Productive cough:     Wheezing:         Neurologic    Sudden weakness in arms or legs:     Sudden numbness in arms or legs:     Sudden onset of difficulty speaking or slurred speech:    Temporary loss of vision in one eye:     Problems with dizziness:         Gastrointestinal    Blood in stool:     Vomited blood:         Genitourinary    Burning when urinating:     Blood in urine:        Psychiatric    Major depression:         Hematologic    Bleeding problems:    Problems with blood clotting too easily:        Skin    Rashes or ulcers:        Constitutional    Fever or chills:      PHYSICAL EXAM: Vitals:   02/02/16 1310  BP: 113/74  Pulse: 97  Resp: 16  Temp: 98.2 F (36.8 C)  TempSrc: Oral  SpO2: 94%  Weight: 158 lb  (71.7 kg)  Height: 5\' 8"  (1.727 m)    GENERAL: The patient is a well-nourished male, in no acute distress. The vital signs are documented above. CARDIOVASCULAR: Carotid arteries are without bruits bilaterally. 2+ radial 2+ femoral 2+ popliteal and 2+ dorsalis pedis and posterior tibial pulses bilaterally. PULMONARY: There is good air exchange  ABDOMEN: Soft and non-tender  MUSCULOSKELETAL: There are no major deformities or cyanosis. NEUROLOGIC: No focal weakness or paresthesias are detected. SKIN: There are no ulcers or rashes noted. PSYCHIATRIC: The patient has a normal affect.  DATA:  Noninvasive studies were reviewed with the patient. This shows normal ankle arm index bilaterally and normal triphasic waveforms bilaterally  MEDICAL ISSUES: I discussed these findings with the patient. I did not clear as to the cause of his left leg discomfort. I explained that he does not have any evidence of arterial insufficiency. He was relieved with this discussion. Also explained that if he would require any further evaluation, the next step would most likely be neurologic evaluation. He will see [place] again on as-needed basis   Rosetta Posner, MD [facility] Tel [phone] Pager [phone]

## 2016-02-11 DIAGNOSIS — H26491 Other secondary cataract, right eye: Secondary | ICD-10-CM | POA: Diagnosis not present

## 2016-02-25 DIAGNOSIS — H26492 Other secondary cataract, left eye: Secondary | ICD-10-CM | POA: Diagnosis not present

## 2016-04-25 DIAGNOSIS — E119 Type 2 diabetes mellitus without complications: Secondary | ICD-10-CM | POA: Diagnosis not present

## 2016-04-25 DIAGNOSIS — Z1389 Encounter for screening for other disorder: Secondary | ICD-10-CM | POA: Diagnosis not present

## 2016-04-25 DIAGNOSIS — N200 Calculus of kidney: Secondary | ICD-10-CM | POA: Diagnosis not present

## 2016-04-25 DIAGNOSIS — E784 Other hyperlipidemia: Secondary | ICD-10-CM | POA: Diagnosis not present

## 2016-04-25 DIAGNOSIS — K635 Polyp of colon: Secondary | ICD-10-CM | POA: Diagnosis not present

## 2016-04-25 DIAGNOSIS — D751 Secondary polycythemia: Secondary | ICD-10-CM | POA: Diagnosis not present

## 2016-04-25 DIAGNOSIS — Z720 Tobacco use: Secondary | ICD-10-CM | POA: Diagnosis not present

## 2016-04-25 DIAGNOSIS — I739 Peripheral vascular disease, unspecified: Secondary | ICD-10-CM | POA: Diagnosis not present

## 2016-04-25 DIAGNOSIS — F320 Major depressive disorder, single episode, mild: Secondary | ICD-10-CM | POA: Diagnosis not present

## 2016-04-25 DIAGNOSIS — C61 Malignant neoplasm of prostate: Secondary | ICD-10-CM | POA: Diagnosis not present

## 2016-05-02 DIAGNOSIS — Z6823 Body mass index (BMI) 23.0-23.9, adult: Secondary | ICD-10-CM | POA: Diagnosis not present

## 2016-05-02 DIAGNOSIS — R0781 Pleurodynia: Secondary | ICD-10-CM | POA: Diagnosis not present

## 2016-05-02 DIAGNOSIS — R0989 Other specified symptoms and signs involving the circulatory and respiratory systems: Secondary | ICD-10-CM | POA: Diagnosis not present

## 2016-05-02 DIAGNOSIS — W08XXXA Fall from other furniture, initial encounter: Secondary | ICD-10-CM | POA: Diagnosis not present

## 2016-07-27 DIAGNOSIS — M79642 Pain in left hand: Secondary | ICD-10-CM | POA: Diagnosis not present

## 2016-07-27 DIAGNOSIS — M65352 Trigger finger, left little finger: Secondary | ICD-10-CM | POA: Diagnosis not present

## 2016-08-23 DIAGNOSIS — F320 Major depressive disorder, single episode, mild: Secondary | ICD-10-CM | POA: Diagnosis not present

## 2016-08-23 DIAGNOSIS — I7389 Other specified peripheral vascular diseases: Secondary | ICD-10-CM | POA: Diagnosis not present

## 2016-08-23 DIAGNOSIS — N200 Calculus of kidney: Secondary | ICD-10-CM | POA: Diagnosis not present

## 2016-08-23 DIAGNOSIS — Z6822 Body mass index (BMI) 22.0-22.9, adult: Secondary | ICD-10-CM | POA: Diagnosis not present

## 2016-08-23 DIAGNOSIS — E784 Other hyperlipidemia: Secondary | ICD-10-CM | POA: Diagnosis not present

## 2016-08-23 DIAGNOSIS — Z1389 Encounter for screening for other disorder: Secondary | ICD-10-CM | POA: Diagnosis not present

## 2016-08-23 DIAGNOSIS — C61 Malignant neoplasm of prostate: Secondary | ICD-10-CM | POA: Diagnosis not present

## 2016-08-23 DIAGNOSIS — E119 Type 2 diabetes mellitus without complications: Secondary | ICD-10-CM | POA: Diagnosis not present

## 2016-08-23 DIAGNOSIS — K635 Polyp of colon: Secondary | ICD-10-CM | POA: Diagnosis not present

## 2016-10-15 DIAGNOSIS — Z23 Encounter for immunization: Secondary | ICD-10-CM | POA: Diagnosis not present

## 2016-10-17 DIAGNOSIS — D751 Secondary polycythemia: Secondary | ICD-10-CM | POA: Diagnosis not present

## 2016-11-23 DIAGNOSIS — Z8546 Personal history of malignant neoplasm of prostate: Secondary | ICD-10-CM | POA: Diagnosis not present

## 2016-12-02 DIAGNOSIS — N5201 Erectile dysfunction due to arterial insufficiency: Secondary | ICD-10-CM | POA: Diagnosis not present

## 2016-12-02 DIAGNOSIS — Z8546 Personal history of malignant neoplasm of prostate: Secondary | ICD-10-CM | POA: Diagnosis not present

## 2016-12-02 DIAGNOSIS — Z87442 Personal history of urinary calculi: Secondary | ICD-10-CM | POA: Diagnosis not present

## 2017-01-10 DIAGNOSIS — D751 Secondary polycythemia: Secondary | ICD-10-CM | POA: Diagnosis not present

## 2017-01-10 DIAGNOSIS — C61 Malignant neoplasm of prostate: Secondary | ICD-10-CM | POA: Diagnosis not present

## 2017-01-10 DIAGNOSIS — F320 Major depressive disorder, single episode, mild: Secondary | ICD-10-CM | POA: Diagnosis not present

## 2017-01-10 DIAGNOSIS — N202 Calculus of kidney with calculus of ureter: Secondary | ICD-10-CM | POA: Diagnosis not present

## 2017-01-10 DIAGNOSIS — K635 Polyp of colon: Secondary | ICD-10-CM | POA: Diagnosis not present

## 2017-01-10 DIAGNOSIS — Z6822 Body mass index (BMI) 22.0-22.9, adult: Secondary | ICD-10-CM | POA: Diagnosis not present

## 2017-01-10 DIAGNOSIS — Z720 Tobacco use: Secondary | ICD-10-CM | POA: Diagnosis not present

## 2017-01-10 DIAGNOSIS — E7849 Other hyperlipidemia: Secondary | ICD-10-CM | POA: Diagnosis not present

## 2017-01-10 DIAGNOSIS — I739 Peripheral vascular disease, unspecified: Secondary | ICD-10-CM | POA: Diagnosis not present

## 2017-01-10 DIAGNOSIS — E119 Type 2 diabetes mellitus without complications: Secondary | ICD-10-CM | POA: Diagnosis not present

## 2017-01-10 DIAGNOSIS — Z1389 Encounter for screening for other disorder: Secondary | ICD-10-CM | POA: Diagnosis not present

## 2017-01-23 DIAGNOSIS — E119 Type 2 diabetes mellitus without complications: Secondary | ICD-10-CM | POA: Diagnosis not present

## 2017-01-23 DIAGNOSIS — H40013 Open angle with borderline findings, low risk, bilateral: Secondary | ICD-10-CM | POA: Diagnosis not present

## 2017-01-23 DIAGNOSIS — H35033 Hypertensive retinopathy, bilateral: Secondary | ICD-10-CM | POA: Diagnosis not present

## 2017-01-23 DIAGNOSIS — H35342 Macular cyst, hole, or pseudohole, left eye: Secondary | ICD-10-CM | POA: Diagnosis not present

## 2017-01-25 ENCOUNTER — Encounter (INDEPENDENT_AMBULATORY_CARE_PROVIDER_SITE_OTHER): Payer: PPO | Admitting: Ophthalmology

## 2017-01-25 DIAGNOSIS — H43813 Vitreous degeneration, bilateral: Secondary | ICD-10-CM

## 2017-01-25 DIAGNOSIS — E113312 Type 2 diabetes mellitus with moderate nonproliferative diabetic retinopathy with macular edema, left eye: Secondary | ICD-10-CM

## 2017-01-25 DIAGNOSIS — E11311 Type 2 diabetes mellitus with unspecified diabetic retinopathy with macular edema: Secondary | ICD-10-CM | POA: Diagnosis not present

## 2017-01-25 DIAGNOSIS — E113391 Type 2 diabetes mellitus with moderate nonproliferative diabetic retinopathy without macular edema, right eye: Secondary | ICD-10-CM

## 2017-02-22 ENCOUNTER — Encounter (INDEPENDENT_AMBULATORY_CARE_PROVIDER_SITE_OTHER): Payer: PPO | Admitting: Ophthalmology

## 2017-02-22 DIAGNOSIS — E113312 Type 2 diabetes mellitus with moderate nonproliferative diabetic retinopathy with macular edema, left eye: Secondary | ICD-10-CM | POA: Diagnosis not present

## 2017-02-22 DIAGNOSIS — E11311 Type 2 diabetes mellitus with unspecified diabetic retinopathy with macular edema: Secondary | ICD-10-CM | POA: Diagnosis not present

## 2017-02-22 DIAGNOSIS — E113391 Type 2 diabetes mellitus with moderate nonproliferative diabetic retinopathy without macular edema, right eye: Secondary | ICD-10-CM

## 2017-02-22 DIAGNOSIS — H43813 Vitreous degeneration, bilateral: Secondary | ICD-10-CM | POA: Diagnosis not present

## 2017-03-21 ENCOUNTER — Encounter (INDEPENDENT_AMBULATORY_CARE_PROVIDER_SITE_OTHER): Payer: PPO | Admitting: Ophthalmology

## 2017-03-21 DIAGNOSIS — E113291 Type 2 diabetes mellitus with mild nonproliferative diabetic retinopathy without macular edema, right eye: Secondary | ICD-10-CM

## 2017-03-21 DIAGNOSIS — E113212 Type 2 diabetes mellitus with mild nonproliferative diabetic retinopathy with macular edema, left eye: Secondary | ICD-10-CM

## 2017-03-21 DIAGNOSIS — H43813 Vitreous degeneration, bilateral: Secondary | ICD-10-CM | POA: Diagnosis not present

## 2017-03-21 DIAGNOSIS — E11311 Type 2 diabetes mellitus with unspecified diabetic retinopathy with macular edema: Secondary | ICD-10-CM

## 2017-04-18 ENCOUNTER — Encounter (INDEPENDENT_AMBULATORY_CARE_PROVIDER_SITE_OTHER): Payer: PPO | Admitting: Ophthalmology

## 2017-04-18 DIAGNOSIS — E113312 Type 2 diabetes mellitus with moderate nonproliferative diabetic retinopathy with macular edema, left eye: Secondary | ICD-10-CM

## 2017-04-18 DIAGNOSIS — H43813 Vitreous degeneration, bilateral: Secondary | ICD-10-CM

## 2017-04-18 DIAGNOSIS — E113291 Type 2 diabetes mellitus with mild nonproliferative diabetic retinopathy without macular edema, right eye: Secondary | ICD-10-CM | POA: Diagnosis not present

## 2017-04-18 DIAGNOSIS — E11311 Type 2 diabetes mellitus with unspecified diabetic retinopathy with macular edema: Secondary | ICD-10-CM

## 2017-05-10 DIAGNOSIS — M65342 Trigger finger, left ring finger: Secondary | ICD-10-CM | POA: Diagnosis not present

## 2017-05-10 DIAGNOSIS — M65352 Trigger finger, left little finger: Secondary | ICD-10-CM | POA: Diagnosis not present

## 2017-05-16 ENCOUNTER — Encounter (INDEPENDENT_AMBULATORY_CARE_PROVIDER_SITE_OTHER): Payer: PPO | Admitting: Ophthalmology

## 2017-05-16 DIAGNOSIS — H43813 Vitreous degeneration, bilateral: Secondary | ICD-10-CM

## 2017-05-16 DIAGNOSIS — E113212 Type 2 diabetes mellitus with mild nonproliferative diabetic retinopathy with macular edema, left eye: Secondary | ICD-10-CM | POA: Diagnosis not present

## 2017-05-16 DIAGNOSIS — Z1389 Encounter for screening for other disorder: Secondary | ICD-10-CM | POA: Diagnosis not present

## 2017-05-16 DIAGNOSIS — E119 Type 2 diabetes mellitus without complications: Secondary | ICD-10-CM | POA: Diagnosis not present

## 2017-05-16 DIAGNOSIS — K635 Polyp of colon: Secondary | ICD-10-CM | POA: Diagnosis not present

## 2017-05-16 DIAGNOSIS — E113291 Type 2 diabetes mellitus with mild nonproliferative diabetic retinopathy without macular edema, right eye: Secondary | ICD-10-CM

## 2017-05-16 DIAGNOSIS — I739 Peripheral vascular disease, unspecified: Secondary | ICD-10-CM | POA: Diagnosis not present

## 2017-05-16 DIAGNOSIS — N200 Calculus of kidney: Secondary | ICD-10-CM | POA: Diagnosis not present

## 2017-05-16 DIAGNOSIS — Z720 Tobacco use: Secondary | ICD-10-CM | POA: Diagnosis not present

## 2017-05-16 DIAGNOSIS — Z6822 Body mass index (BMI) 22.0-22.9, adult: Secondary | ICD-10-CM | POA: Diagnosis not present

## 2017-05-16 DIAGNOSIS — E11311 Type 2 diabetes mellitus with unspecified diabetic retinopathy with macular edema: Secondary | ICD-10-CM | POA: Diagnosis not present

## 2017-05-16 DIAGNOSIS — D751 Secondary polycythemia: Secondary | ICD-10-CM | POA: Diagnosis not present

## 2017-05-16 DIAGNOSIS — C61 Malignant neoplasm of prostate: Secondary | ICD-10-CM | POA: Diagnosis not present

## 2017-05-16 DIAGNOSIS — E7849 Other hyperlipidemia: Secondary | ICD-10-CM | POA: Diagnosis not present

## 2017-06-16 ENCOUNTER — Encounter (INDEPENDENT_AMBULATORY_CARE_PROVIDER_SITE_OTHER): Payer: PPO | Admitting: Ophthalmology

## 2017-06-16 DIAGNOSIS — H43813 Vitreous degeneration, bilateral: Secondary | ICD-10-CM

## 2017-06-16 DIAGNOSIS — E113391 Type 2 diabetes mellitus with moderate nonproliferative diabetic retinopathy without macular edema, right eye: Secondary | ICD-10-CM | POA: Diagnosis not present

## 2017-06-16 DIAGNOSIS — E113312 Type 2 diabetes mellitus with moderate nonproliferative diabetic retinopathy with macular edema, left eye: Secondary | ICD-10-CM

## 2017-06-16 DIAGNOSIS — E11311 Type 2 diabetes mellitus with unspecified diabetic retinopathy with macular edema: Secondary | ICD-10-CM

## 2017-06-21 DIAGNOSIS — M65342 Trigger finger, left ring finger: Secondary | ICD-10-CM | POA: Diagnosis not present

## 2017-07-19 ENCOUNTER — Encounter (INDEPENDENT_AMBULATORY_CARE_PROVIDER_SITE_OTHER): Payer: PPO | Admitting: Ophthalmology

## 2017-07-19 DIAGNOSIS — E113312 Type 2 diabetes mellitus with moderate nonproliferative diabetic retinopathy with macular edema, left eye: Secondary | ICD-10-CM | POA: Diagnosis not present

## 2017-07-19 DIAGNOSIS — E11311 Type 2 diabetes mellitus with unspecified diabetic retinopathy with macular edema: Secondary | ICD-10-CM | POA: Diagnosis not present

## 2017-07-19 DIAGNOSIS — H43813 Vitreous degeneration, bilateral: Secondary | ICD-10-CM

## 2017-07-19 DIAGNOSIS — E113291 Type 2 diabetes mellitus with mild nonproliferative diabetic retinopathy without macular edema, right eye: Secondary | ICD-10-CM | POA: Diagnosis not present

## 2017-08-23 ENCOUNTER — Encounter (INDEPENDENT_AMBULATORY_CARE_PROVIDER_SITE_OTHER): Payer: PPO | Admitting: Ophthalmology

## 2017-08-23 DIAGNOSIS — E11311 Type 2 diabetes mellitus with unspecified diabetic retinopathy with macular edema: Secondary | ICD-10-CM | POA: Diagnosis not present

## 2017-08-23 DIAGNOSIS — H43813 Vitreous degeneration, bilateral: Secondary | ICD-10-CM

## 2017-08-23 DIAGNOSIS — E113212 Type 2 diabetes mellitus with mild nonproliferative diabetic retinopathy with macular edema, left eye: Secondary | ICD-10-CM | POA: Diagnosis not present

## 2017-08-23 DIAGNOSIS — E113291 Type 2 diabetes mellitus with mild nonproliferative diabetic retinopathy without macular edema, right eye: Secondary | ICD-10-CM | POA: Diagnosis not present

## 2017-08-30 DIAGNOSIS — E7849 Other hyperlipidemia: Secondary | ICD-10-CM | POA: Diagnosis not present

## 2017-08-30 DIAGNOSIS — E1139 Type 2 diabetes mellitus with other diabetic ophthalmic complication: Secondary | ICD-10-CM | POA: Diagnosis not present

## 2017-08-30 DIAGNOSIS — Z125 Encounter for screening for malignant neoplasm of prostate: Secondary | ICD-10-CM | POA: Diagnosis not present

## 2017-08-30 DIAGNOSIS — R82998 Other abnormal findings in urine: Secondary | ICD-10-CM | POA: Diagnosis not present

## 2017-09-06 DIAGNOSIS — Z6822 Body mass index (BMI) 22.0-22.9, adult: Secondary | ICD-10-CM | POA: Diagnosis not present

## 2017-09-06 DIAGNOSIS — Z0000 Encounter for general adult medical examination without abnormal findings: Secondary | ICD-10-CM | POA: Diagnosis not present

## 2017-09-06 DIAGNOSIS — N200 Calculus of kidney: Secondary | ICD-10-CM | POA: Diagnosis not present

## 2017-09-06 DIAGNOSIS — E1139 Type 2 diabetes mellitus with other diabetic ophthalmic complication: Secondary | ICD-10-CM | POA: Diagnosis not present

## 2017-09-06 DIAGNOSIS — Z1389 Encounter for screening for other disorder: Secondary | ICD-10-CM | POA: Diagnosis not present

## 2017-09-06 DIAGNOSIS — K635 Polyp of colon: Secondary | ICD-10-CM | POA: Diagnosis not present

## 2017-09-06 DIAGNOSIS — I739 Peripheral vascular disease, unspecified: Secondary | ICD-10-CM | POA: Diagnosis not present

## 2017-09-06 DIAGNOSIS — E7849 Other hyperlipidemia: Secondary | ICD-10-CM | POA: Diagnosis not present

## 2017-09-06 DIAGNOSIS — D751 Secondary polycythemia: Secondary | ICD-10-CM | POA: Diagnosis not present

## 2017-09-06 DIAGNOSIS — E11311 Type 2 diabetes mellitus with unspecified diabetic retinopathy with macular edema: Secondary | ICD-10-CM | POA: Diagnosis not present

## 2017-09-06 DIAGNOSIS — Z23 Encounter for immunization: Secondary | ICD-10-CM | POA: Diagnosis not present

## 2017-09-08 ENCOUNTER — Other Ambulatory Visit: Payer: Self-pay | Admitting: Endocrinology

## 2017-09-08 DIAGNOSIS — Z720 Tobacco use: Secondary | ICD-10-CM

## 2017-09-15 DIAGNOSIS — Z1212 Encounter for screening for malignant neoplasm of rectum: Secondary | ICD-10-CM | POA: Diagnosis not present

## 2017-09-18 ENCOUNTER — Ambulatory Visit
Admission: RE | Admit: 2017-09-18 | Discharge: 2017-09-18 | Disposition: A | Discharge status: Home / Self Care | Payer: PPO | Source: Ambulatory Visit | Attending: Endocrinology | Admitting: Endocrinology

## 2017-09-18 DIAGNOSIS — Z720 Tobacco use: Secondary | ICD-10-CM

## 2017-09-18 DIAGNOSIS — F17210 Nicotine dependence, cigarettes, uncomplicated: Secondary | ICD-10-CM | POA: Diagnosis not present

## 2017-09-29 ENCOUNTER — Encounter (INDEPENDENT_AMBULATORY_CARE_PROVIDER_SITE_OTHER): Payer: PPO | Admitting: Ophthalmology

## 2017-09-29 DIAGNOSIS — H43813 Vitreous degeneration, bilateral: Secondary | ICD-10-CM | POA: Diagnosis not present

## 2017-09-29 DIAGNOSIS — E11319 Type 2 diabetes mellitus with unspecified diabetic retinopathy without macular edema: Secondary | ICD-10-CM | POA: Diagnosis not present

## 2017-09-29 DIAGNOSIS — E113293 Type 2 diabetes mellitus with mild nonproliferative diabetic retinopathy without macular edema, bilateral: Secondary | ICD-10-CM

## 2017-11-03 ENCOUNTER — Encounter (INDEPENDENT_AMBULATORY_CARE_PROVIDER_SITE_OTHER): Payer: PPO | Admitting: Ophthalmology

## 2017-11-03 DIAGNOSIS — E113212 Type 2 diabetes mellitus with mild nonproliferative diabetic retinopathy with macular edema, left eye: Secondary | ICD-10-CM

## 2017-11-03 DIAGNOSIS — E11311 Type 2 diabetes mellitus with unspecified diabetic retinopathy with macular edema: Secondary | ICD-10-CM | POA: Diagnosis not present

## 2017-11-03 DIAGNOSIS — H43813 Vitreous degeneration, bilateral: Secondary | ICD-10-CM

## 2017-11-03 DIAGNOSIS — H35373 Puckering of macula, bilateral: Secondary | ICD-10-CM

## 2017-11-03 DIAGNOSIS — E113291 Type 2 diabetes mellitus with mild nonproliferative diabetic retinopathy without macular edema, right eye: Secondary | ICD-10-CM | POA: Diagnosis not present

## 2017-12-06 DIAGNOSIS — H40013 Open angle with borderline findings, low risk, bilateral: Secondary | ICD-10-CM | POA: Diagnosis not present

## 2017-12-06 DIAGNOSIS — H01009 Unspecified blepharitis unspecified eye, unspecified eyelid: Secondary | ICD-10-CM | POA: Diagnosis not present

## 2017-12-06 DIAGNOSIS — H01003 Unspecified blepharitis right eye, unspecified eyelid: Secondary | ICD-10-CM | POA: Diagnosis not present

## 2017-12-06 DIAGNOSIS — H1851 Endothelial corneal dystrophy: Secondary | ICD-10-CM | POA: Diagnosis not present

## 2017-12-08 ENCOUNTER — Encounter (INDEPENDENT_AMBULATORY_CARE_PROVIDER_SITE_OTHER): Payer: PPO | Admitting: Ophthalmology

## 2017-12-08 DIAGNOSIS — E11311 Type 2 diabetes mellitus with unspecified diabetic retinopathy with macular edema: Secondary | ICD-10-CM | POA: Diagnosis not present

## 2017-12-08 DIAGNOSIS — E113312 Type 2 diabetes mellitus with moderate nonproliferative diabetic retinopathy with macular edema, left eye: Secondary | ICD-10-CM | POA: Diagnosis not present

## 2017-12-08 DIAGNOSIS — H43813 Vitreous degeneration, bilateral: Secondary | ICD-10-CM

## 2017-12-08 DIAGNOSIS — E113211 Type 2 diabetes mellitus with mild nonproliferative diabetic retinopathy with macular edema, right eye: Secondary | ICD-10-CM

## 2017-12-18 DIAGNOSIS — M65341 Trigger finger, right ring finger: Secondary | ICD-10-CM | POA: Diagnosis not present

## 2018-01-24 DIAGNOSIS — N200 Calculus of kidney: Secondary | ICD-10-CM | POA: Diagnosis not present

## 2018-01-24 DIAGNOSIS — I739 Peripheral vascular disease, unspecified: Secondary | ICD-10-CM | POA: Diagnosis not present

## 2018-01-24 DIAGNOSIS — E7849 Other hyperlipidemia: Secondary | ICD-10-CM | POA: Diagnosis not present

## 2018-01-24 DIAGNOSIS — Z720 Tobacco use: Secondary | ICD-10-CM | POA: Diagnosis not present

## 2018-01-24 DIAGNOSIS — Z6823 Body mass index (BMI) 23.0-23.9, adult: Secondary | ICD-10-CM | POA: Diagnosis not present

## 2018-01-24 DIAGNOSIS — D751 Secondary polycythemia: Secondary | ICD-10-CM | POA: Diagnosis not present

## 2018-01-24 DIAGNOSIS — E1139 Type 2 diabetes mellitus with other diabetic ophthalmic complication: Secondary | ICD-10-CM | POA: Diagnosis not present

## 2018-01-24 DIAGNOSIS — J069 Acute upper respiratory infection, unspecified: Secondary | ICD-10-CM | POA: Diagnosis not present

## 2018-01-24 DIAGNOSIS — E119 Type 2 diabetes mellitus without complications: Secondary | ICD-10-CM | POA: Diagnosis not present

## 2018-01-24 DIAGNOSIS — E11311 Type 2 diabetes mellitus with unspecified diabetic retinopathy with macular edema: Secondary | ICD-10-CM | POA: Diagnosis not present

## 2018-01-24 DIAGNOSIS — C61 Malignant neoplasm of prostate: Secondary | ICD-10-CM | POA: Diagnosis not present

## 2018-01-30 ENCOUNTER — Other Ambulatory Visit: Payer: Self-pay | Admitting: Physician Assistant

## 2018-01-30 DIAGNOSIS — B078 Other viral warts: Secondary | ICD-10-CM | POA: Diagnosis not present

## 2018-01-30 DIAGNOSIS — D229 Melanocytic nevi, unspecified: Secondary | ICD-10-CM | POA: Diagnosis not present

## 2018-01-30 DIAGNOSIS — D485 Neoplasm of uncertain behavior of skin: Secondary | ICD-10-CM | POA: Diagnosis not present

## 2018-02-01 ENCOUNTER — Encounter (INDEPENDENT_AMBULATORY_CARE_PROVIDER_SITE_OTHER): Payer: PPO | Admitting: Ophthalmology

## 2018-02-01 DIAGNOSIS — E113311 Type 2 diabetes mellitus with moderate nonproliferative diabetic retinopathy with macular edema, right eye: Secondary | ICD-10-CM | POA: Diagnosis not present

## 2018-02-01 DIAGNOSIS — H43813 Vitreous degeneration, bilateral: Secondary | ICD-10-CM | POA: Diagnosis not present

## 2018-02-01 DIAGNOSIS — E113292 Type 2 diabetes mellitus with mild nonproliferative diabetic retinopathy without macular edema, left eye: Secondary | ICD-10-CM

## 2018-02-01 DIAGNOSIS — E11311 Type 2 diabetes mellitus with unspecified diabetic retinopathy with macular edema: Secondary | ICD-10-CM | POA: Diagnosis not present

## 2018-02-08 DIAGNOSIS — E113313 Type 2 diabetes mellitus with moderate nonproliferative diabetic retinopathy with macular edema, bilateral: Secondary | ICD-10-CM | POA: Diagnosis not present

## 2018-02-08 DIAGNOSIS — I7090 Unspecified atherosclerosis: Secondary | ICD-10-CM | POA: Diagnosis not present

## 2018-02-08 DIAGNOSIS — H40013 Open angle with borderline findings, low risk, bilateral: Secondary | ICD-10-CM | POA: Diagnosis not present

## 2018-02-08 DIAGNOSIS — H35033 Hypertensive retinopathy, bilateral: Secondary | ICD-10-CM | POA: Diagnosis not present

## 2018-02-12 DIAGNOSIS — J209 Acute bronchitis, unspecified: Secondary | ICD-10-CM | POA: Diagnosis not present

## 2018-02-12 DIAGNOSIS — Z6823 Body mass index (BMI) 23.0-23.9, adult: Secondary | ICD-10-CM | POA: Diagnosis not present

## 2018-02-12 DIAGNOSIS — J069 Acute upper respiratory infection, unspecified: Secondary | ICD-10-CM | POA: Diagnosis not present

## 2018-03-02 DIAGNOSIS — M65341 Trigger finger, right ring finger: Secondary | ICD-10-CM | POA: Diagnosis not present

## 2018-03-02 DIAGNOSIS — M65311 Trigger thumb, right thumb: Secondary | ICD-10-CM | POA: Diagnosis not present

## 2018-03-14 ENCOUNTER — Other Ambulatory Visit: Payer: Self-pay

## 2018-03-14 ENCOUNTER — Encounter (HOSPITAL_BASED_OUTPATIENT_CLINIC_OR_DEPARTMENT_OTHER): Payer: Self-pay

## 2018-03-15 ENCOUNTER — Encounter (HOSPITAL_BASED_OUTPATIENT_CLINIC_OR_DEPARTMENT_OTHER)
Admission: RE | Admit: 2018-03-15 | Discharge: 2018-03-15 | Disposition: A | Discharge status: Home / Self Care | Payer: PPO | Source: Ambulatory Visit | Attending: Orthopedic Surgery | Admitting: Orthopedic Surgery

## 2018-03-15 ENCOUNTER — Other Ambulatory Visit: Payer: Self-pay

## 2018-03-15 DIAGNOSIS — Z01818 Encounter for other preprocedural examination: Secondary | ICD-10-CM | POA: Diagnosis not present

## 2018-03-15 DIAGNOSIS — R9431 Abnormal electrocardiogram [ECG] [EKG]: Secondary | ICD-10-CM | POA: Insufficient documentation

## 2018-03-15 LAB — BASIC METABOLIC PANEL
Anion gap: 13 (ref 5–15)
BUN: 15 mg/dL (ref 8–23)
CO2: 29 mmol/L (ref 22–32)
CREATININE: 0.69 mg/dL (ref 0.61–1.24)
Calcium: 9.6 mg/dL (ref 8.9–10.3)
Chloride: 93 mmol/L — ABNORMAL LOW (ref 98–111)
GFR calc non Af Amer: >60 mL/min (ref >60)
Glucose, Bld: 240 mg/dL — ABNORMAL HIGH (ref 70–99)
Potassium: 3.5 mmol/L (ref 3.5–5.1)
Sodium: 135 mmol/L (ref 135–145)

## 2018-03-15 NOTE — Progress Notes (Addendum)
EKG reviewed by Dr. Kerin Perna, will proceed with surgery as scheduled.  Glucose-240, notified Jeani Hawking at Dr. Levell July office, and notified patient.

## 2018-03-19 ENCOUNTER — Other Ambulatory Visit: Payer: Self-pay | Admitting: Orthopedic Surgery

## 2018-03-22 ENCOUNTER — Ambulatory Visit (HOSPITAL_BASED_OUTPATIENT_CLINIC_OR_DEPARTMENT_OTHER): Admission: RE | Admit: 2018-03-22 | Payer: PPO | Source: Home / Self Care | Admitting: Orthopedic Surgery

## 2018-03-22 HISTORY — DX: Personal history of urinary calculi: Z87.442

## 2018-03-22 SURGERY — RELEASE, A1 PULLEY, FOR TRIGGER FINGER
Anesthesia: Regional | Laterality: Right

## 2018-03-29 ENCOUNTER — Other Ambulatory Visit: Payer: Self-pay

## 2018-03-29 ENCOUNTER — Encounter (INDEPENDENT_AMBULATORY_CARE_PROVIDER_SITE_OTHER): Payer: PPO | Admitting: Ophthalmology

## 2018-03-29 DIAGNOSIS — E11311 Type 2 diabetes mellitus with unspecified diabetic retinopathy with macular edema: Secondary | ICD-10-CM

## 2018-03-29 DIAGNOSIS — E113213 Type 2 diabetes mellitus with mild nonproliferative diabetic retinopathy with macular edema, bilateral: Secondary | ICD-10-CM

## 2018-03-29 DIAGNOSIS — H43813 Vitreous degeneration, bilateral: Secondary | ICD-10-CM | POA: Diagnosis not present

## 2018-05-09 ENCOUNTER — Other Ambulatory Visit: Payer: Self-pay

## 2018-05-09 ENCOUNTER — Encounter (HOSPITAL_BASED_OUTPATIENT_CLINIC_OR_DEPARTMENT_OTHER): Payer: Self-pay | Admitting: *Deleted

## 2018-05-10 ENCOUNTER — Other Ambulatory Visit: Payer: Self-pay | Admitting: Orthopedic Surgery

## 2018-05-10 ENCOUNTER — Encounter (HOSPITAL_BASED_OUTPATIENT_CLINIC_OR_DEPARTMENT_OTHER)
Admission: RE | Admit: 2018-05-10 | Discharge: 2018-05-10 | Disposition: A | Discharge status: Home / Self Care | Payer: PPO | Source: Ambulatory Visit | Attending: Orthopedic Surgery | Admitting: Orthopedic Surgery

## 2018-05-10 DIAGNOSIS — Z1159 Encounter for screening for other viral diseases: Secondary | ICD-10-CM | POA: Diagnosis not present

## 2018-05-10 DIAGNOSIS — Z01812 Encounter for preprocedural laboratory examination: Secondary | ICD-10-CM | POA: Insufficient documentation

## 2018-05-10 LAB — BASIC METABOLIC PANEL
Anion gap: 12 (ref 5–15)
BUN: 18 mg/dL (ref 8–23)
CO2: 32 mmol/L (ref 22–32)
Calcium: 9.4 mg/dL (ref 8.9–10.3)
Chloride: 93 mmol/L — ABNORMAL LOW (ref 98–111)
Creatinine, Ser: 0.66 mg/dL (ref 0.61–1.24)
GFR calc Af Amer: >60 mL/min (ref >60)
GFR calc non Af Amer: >60 mL/min (ref >60)
Glucose, Bld: 179 mg/dL — ABNORMAL HIGH (ref 70–99)
Potassium: 3.9 mmol/L (ref 3.5–5.1)
Sodium: 137 mmol/L (ref 135–145)

## 2018-05-11 ENCOUNTER — Other Ambulatory Visit: Payer: Self-pay

## 2018-05-11 ENCOUNTER — Other Ambulatory Visit (HOSPITAL_COMMUNITY)
Admission: RE | Admit: 2018-05-11 | Discharge: 2018-05-11 | Disposition: A | Discharge status: Home / Self Care | Payer: PPO | Source: Ambulatory Visit | Attending: Orthopedic Surgery | Admitting: Orthopedic Surgery

## 2018-05-11 DIAGNOSIS — Z01812 Encounter for preprocedural laboratory examination: Secondary | ICD-10-CM | POA: Diagnosis not present

## 2018-05-13 LAB — NOVEL CORONAVIRUS, NAA (HOSP ORDER, SEND-OUT TO REF LAB; TAT 18-24 HRS): SARS-CoV-2, NAA: NOT DETECTED

## 2018-05-14 ENCOUNTER — Encounter (HOSPITAL_BASED_OUTPATIENT_CLINIC_OR_DEPARTMENT_OTHER): Payer: Self-pay | Admitting: *Deleted

## 2018-05-14 NOTE — Anesthesia Preprocedure Evaluation (Addendum)
Anesthesia Evaluation  Patient identified by MRN, date of birth, ID band Patient awake    Reviewed: Allergy & Precautions, NPO status , Patient's Chart, lab work & pertinent test results  History of Anesthesia Complications Negative for: history of anesthetic complications  Airway Mallampati: II  TM Distance: >3 FB Neck ROM: Full    Dental no notable dental hx.    Pulmonary neg pulmonary ROS, Current Smoker,    Pulmonary exam normal        Cardiovascular hypertension, Pt. on medications Normal cardiovascular exam     Neuro/Psych negative neurological ROS  negative psych ROS   GI/Hepatic negative GI ROS, Neg liver ROS,   Endo/Other  diabetes, Oral Hypoglycemic Agents  Renal/GU negative Renal ROS  negative genitourinary   Musculoskeletal negative musculoskeletal ROS (+)   Abdominal   Peds  Hematology negative hematology ROS (+)   Anesthesia Other Findings COVID negative 5/8  Reproductive/Obstetrics                            Anesthesia Physical Anesthesia Plan  ASA: II  Anesthesia Plan: Bier Block and Bier Block-LIDOCAINE ONLY   Post-op Pain Management:    Induction:   PONV Risk Score and Plan: 0 and Propofol infusion and Treatment may vary due to age or medical condition  Airway Management Planned: Simple Face Mask  Additional Equipment: None  Intra-op Plan:   Post-operative Plan:   Informed Consent: I have reviewed the patients History and Physical, chart, labs and discussed the procedure including the risks, benefits and alternatives for the proposed anesthesia with the patient or authorized representative who has indicated his/her understanding and acceptance.       Plan Discussed with:   Anesthesia Plan Comments:       Anesthesia Quick Evaluation

## 2018-05-15 ENCOUNTER — Ambulatory Visit (HOSPITAL_BASED_OUTPATIENT_CLINIC_OR_DEPARTMENT_OTHER): Payer: PPO | Admitting: Anesthesiology

## 2018-05-15 ENCOUNTER — Encounter (HOSPITAL_BASED_OUTPATIENT_CLINIC_OR_DEPARTMENT_OTHER): Payer: Self-pay | Admitting: *Deleted

## 2018-05-15 ENCOUNTER — Encounter (HOSPITAL_BASED_OUTPATIENT_CLINIC_OR_DEPARTMENT_OTHER)
Admission: RE | Disposition: A | Discharge status: Home / Self Care | Payer: Self-pay | Source: Home / Self Care | Attending: Orthopedic Surgery

## 2018-05-15 ENCOUNTER — Ambulatory Visit (HOSPITAL_BASED_OUTPATIENT_CLINIC_OR_DEPARTMENT_OTHER)
Admission: RE | Admit: 2018-05-15 | Discharge: 2018-05-15 | Disposition: A | Discharge status: Home / Self Care | Payer: PPO | Attending: Orthopedic Surgery | Admitting: Orthopedic Surgery

## 2018-05-15 DIAGNOSIS — M65841 Other synovitis and tenosynovitis, right hand: Secondary | ICD-10-CM | POA: Insufficient documentation

## 2018-05-15 DIAGNOSIS — M65341 Trigger finger, right ring finger: Secondary | ICD-10-CM | POA: Diagnosis not present

## 2018-05-15 DIAGNOSIS — E119 Type 2 diabetes mellitus without complications: Secondary | ICD-10-CM | POA: Insufficient documentation

## 2018-05-15 DIAGNOSIS — Z7982 Long term (current) use of aspirin: Secondary | ICD-10-CM | POA: Diagnosis not present

## 2018-05-15 DIAGNOSIS — F17210 Nicotine dependence, cigarettes, uncomplicated: Secondary | ICD-10-CM | POA: Insufficient documentation

## 2018-05-15 DIAGNOSIS — I10 Essential (primary) hypertension: Secondary | ICD-10-CM | POA: Diagnosis not present

## 2018-05-15 DIAGNOSIS — Z7984 Long term (current) use of oral hypoglycemic drugs: Secondary | ICD-10-CM | POA: Insufficient documentation

## 2018-05-15 DIAGNOSIS — M65311 Trigger thumb, right thumb: Secondary | ICD-10-CM | POA: Diagnosis not present

## 2018-05-15 DIAGNOSIS — Z79899 Other long term (current) drug therapy: Secondary | ICD-10-CM | POA: Diagnosis not present

## 2018-05-15 HISTORY — DX: Trigger finger, unspecified finger: M65.30

## 2018-05-15 HISTORY — PX: TRIGGER FINGER RELEASE: SHX641

## 2018-05-15 HISTORY — DX: Essential (primary) hypertension: I10

## 2018-05-15 LAB — GLUCOSE, CAPILLARY
Glucose-Capillary: 143 mg/dL — ABNORMAL HIGH (ref 70–99)
Glucose-Capillary: 114 mg/dL — ABNORMAL HIGH (ref 70–99)

## 2018-05-15 SURGERY — RELEASE, A1 PULLEY, FOR TRIGGER FINGER
Anesthesia: Regional | Site: Hand | Laterality: Right

## 2018-05-15 MED ORDER — MIDAZOLAM HCL 2 MG/2ML IJ SOLN
1.0000 mg | INTRAMUSCULAR | Status: DC | PRN
  Administered 2018-05-15: 2 mg via INTRAVENOUS

## 2018-05-15 MED ORDER — TRAMADOL HCL 50 MG PO TABS: 50.0000 mg | ORAL_TABLET | Freq: Four times a day (QID) | ORAL | 0 refills | Status: DC | PRN

## 2018-05-15 MED ORDER — CEFAZOLIN SODIUM-DEXTROSE 2-4 GM/100ML-% IV SOLN
INTRAVENOUS | Status: AC
  Filled 2018-05-15: qty 100

## 2018-05-15 MED ORDER — SCOPOLAMINE 1 MG/3DAYS TD PT72: 1.0000 | MEDICATED_PATCH | Freq: Once | TRANSDERMAL | Status: DC | PRN

## 2018-05-15 MED ORDER — BUPIVACAINE HCL (PF) 0.25 % IJ SOLN
INTRAMUSCULAR | Status: AC
  Filled 2018-05-15: qty 30

## 2018-05-15 MED ORDER — MIDAZOLAM HCL 2 MG/2ML IJ SOLN
INTRAMUSCULAR | Status: AC
  Filled 2018-05-15: qty 2

## 2018-05-15 MED ORDER — PROPOFOL 500 MG/50ML IV EMUL
INTRAVENOUS | Status: DC | PRN
  Administered 2018-05-15: 75 ug/kg/min via INTRAVENOUS

## 2018-05-15 MED ORDER — ONDANSETRON HCL 4 MG/2ML IJ SOLN
INTRAMUSCULAR | Status: DC | PRN
  Administered 2018-05-15: 4 mg via INTRAVENOUS

## 2018-05-15 MED ORDER — ONDANSETRON HCL 4 MG/2ML IJ SOLN: 4.0000 mg | Freq: Once | INTRAMUSCULAR | Status: DC | PRN

## 2018-05-15 MED ORDER — OXYCODONE HCL 5 MG PO TABS: 5.0000 mg | ORAL_TABLET | Freq: Once | ORAL | Status: DC | PRN

## 2018-05-15 MED ORDER — FENTANYL CITRATE (PF) 100 MCG/2ML IJ SOLN: 25.0000 ug | INTRAMUSCULAR | Status: DC | PRN

## 2018-05-15 MED ORDER — LACTATED RINGERS IV SOLN
INTRAVENOUS | Status: DC
  Administered 2018-05-15 (×2): via INTRAVENOUS

## 2018-05-15 MED ORDER — CEFAZOLIN SODIUM-DEXTROSE 2-4 GM/100ML-% IV SOLN: 2.0000 g | INTRAVENOUS | Status: DC

## 2018-05-15 MED ORDER — CHLORHEXIDINE GLUCONATE 4 % EX LIQD: 60.0000 mL | Freq: Once | CUTANEOUS | Status: DC

## 2018-05-15 MED ORDER — OXYCODONE HCL 5 MG/5ML PO SOLN: 5.0000 mg | Freq: Once | ORAL | Status: DC | PRN

## 2018-05-15 MED ORDER — BUPIVACAINE HCL (PF) 0.25 % IJ SOLN
INTRAMUSCULAR | Status: DC | PRN
  Administered 2018-05-15: 6 mL

## 2018-05-15 MED ORDER — FENTANYL CITRATE (PF) 100 MCG/2ML IJ SOLN
50.0000 ug | INTRAMUSCULAR | Status: DC | PRN
  Administered 2018-05-15 (×2): 50 ug via INTRAVENOUS

## 2018-05-15 MED ORDER — FENTANYL CITRATE (PF) 100 MCG/2ML IJ SOLN
INTRAMUSCULAR | Status: AC
  Filled 2018-05-15: qty 2

## 2018-05-15 SURGICAL SUPPLY — 37 items
APL PRP STRL LF DISP 70% ISPRP (MISCELLANEOUS) ×1
BLADE SURG 15 STRL LF DISP TIS (BLADE) ×1 IMPLANT
BLADE SURG 15 STRL SS (BLADE) ×3
BNDG CMPR 9X4 STRL LF SNTH (GAUZE/BANDAGES/DRESSINGS)
BNDG COHESIVE 2X5 TAN STRL LF (GAUZE/BANDAGES/DRESSINGS) ×3 IMPLANT
BNDG ESMARK 4X9 LF (GAUZE/BANDAGES/DRESSINGS) IMPLANT
CHLORAPREP W/TINT 26 (MISCELLANEOUS) ×3 IMPLANT
CORD BIPOLAR FORCEPS 12FT (ELECTRODE) IMPLANT
COVER BACK TABLE REUSABLE LG (DRAPES) ×3 IMPLANT
COVER MAYO STAND REUSABLE (DRAPES) ×3 IMPLANT
COVER WAND RF STERILE (DRAPES) IMPLANT
CUFF TOURN SGL QUICK 18X4 (TOURNIQUET CUFF) ×2 IMPLANT
DECANTER SPIKE VIAL GLASS SM (MISCELLANEOUS) IMPLANT
DRAPE EXTREMITY T 121X128X90 (DISPOSABLE) ×3 IMPLANT
DRAPE SURG 17X23 STRL (DRAPES) ×3 IMPLANT
GAUZE SPONGE 4X4 12PLY STRL (GAUZE/BANDAGES/DRESSINGS) ×3 IMPLANT
GAUZE XEROFORM 1X8 LF (GAUZE/BANDAGES/DRESSINGS) ×3 IMPLANT
GLOVE BIO SURGEON STRL SZ 6.5 (GLOVE) ×1 IMPLANT
GLOVE BIO SURGEONS STRL SZ 6.5 (GLOVE) ×1
GLOVE BIOGEL PI IND STRL 7.0 (GLOVE) IMPLANT
GLOVE BIOGEL PI IND STRL 8.5 (GLOVE) ×1 IMPLANT
GLOVE BIOGEL PI INDICATOR 7.0 (GLOVE) ×2
GLOVE BIOGEL PI INDICATOR 8.5 (GLOVE) ×2
GLOVE SURG ORTHO 8.0 STRL STRW (GLOVE) ×3 IMPLANT
GOWN STRL REUS W/ TWL LRG LVL3 (GOWN DISPOSABLE) ×1 IMPLANT
GOWN STRL REUS W/TWL LRG LVL3 (GOWN DISPOSABLE) ×3
GOWN STRL REUS W/TWL XL LVL3 (GOWN DISPOSABLE) ×3 IMPLANT
NDL PRECISIONGLIDE 27X1.5 (NEEDLE) ×1 IMPLANT
NEEDLE PRECISIONGLIDE 27X1.5 (NEEDLE) ×3 IMPLANT
NS IRRIG 1000ML POUR BTL (IV SOLUTION) ×3 IMPLANT
PACK BASIN DAY SURGERY FS (CUSTOM PROCEDURE TRAY) ×3 IMPLANT
STOCKINETTE 4X48 STRL (DRAPES) ×3 IMPLANT
SUT ETHILON 4 0 PS 2 18 (SUTURE) ×3 IMPLANT
SYR BULB 3OZ (MISCELLANEOUS) ×3 IMPLANT
SYR CONTROL 10ML LL (SYRINGE) ×3 IMPLANT
TOWEL GREEN STERILE FF (TOWEL DISPOSABLE) ×6 IMPLANT
UNDERPAD 30X30 (UNDERPADS AND DIAPERS) ×3 IMPLANT

## 2018-05-15 NOTE — Brief Op Note (Signed)
05/15/2018  9:20 AM  PATIENT:  Charles Sanders  68 y.o. male  PRE-OPERATIVE DIAGNOSIS:  RIGHT RING TRIGGER FINGER, RIGHT THUMB TRIGGER DIGIT  POST-OPERATIVE DIAGNOSIS:  RIGHT RING TRIGGER FINGER, RIGHT THUMB TRIGGER DIGIT  PROCEDURE:  Procedure(s) with comments: RELEASE TRIGGER FINGER/A-1 PULLEY RIGHT RING AND THUMB FINGERS (Right) - FAB  SURGEON:  Surgeon(s) and Role:    * Daryll Brod, MD - Primary  PHYSICIAN ASSISTANT:   ASSISTANTS: none   ANESTHESIA:   local, regional and IV sedation  EBL: 53ml BLOOD ADMINISTERED:none  DRAINS: none   LOCAL MEDICATIONS USED:  BUPIVICAINE   SPECIMEN:  No Specimen  DISPOSITION OF SPECIMEN:  N/A  COUNTS:  YES  TOURNIQUET:   Total Tourniquet Time Documented: Forearm (Right) - 21 minutes Total: Forearm (Right) - 21 minutes   DICTATION: .Dragon Dictation  PLAN OF CARE: Discharge to home after PACU  PATIENT DISPOSITION:  PACU - hemodynamically stable.

## 2018-05-15 NOTE — Transfer of Care (Signed)
Immediate Anesthesia Transfer of Care Note  Patient: Charles Sanders  Procedure(s) Performed: RELEASE TRIGGER FINGER/A-1 PULLEY RIGHT RING AND THUMB FINGERS (Right Hand)  Patient Location: PACU  Anesthesia Type:MAC and Regional bier  Level of Consciousness: awake, alert  and oriented  Airway & Oxygen Therapy: Patient Spontanous Breathing and Patient connected to face mask oxygen  Post-op Assessment: Report given to RN and Post -op Vital signs reviewed and stable  Post vital signs: Reviewed and stable  Last Vitals:  Vitals Value Taken Time  BP    Temp    Pulse 80 05/15/2018  9:18 AM  Resp 14 05/15/2018  9:18 AM  SpO2 99 % 05/15/2018  9:18 AM  Vitals shown include unvalidated device data.  Last Pain:  Vitals:   05/15/18 0740  TempSrc: Oral  PainSc: 0-No pain         Complications: No apparent anesthesia complications

## 2018-05-15 NOTE — Op Note (Signed)
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   ASSISTANT: none   ANESTHESIA:   Bier block with sedation and local   INTRAVENOUS FLUIDS:  Per anesthesia flow sheet.   ESTIMATED BLOOD LOSS:  Minimal.   COMPLICATIONS:  None.   SPECIMENS:  none   TOURNIQUET TIME:    Total Tourniquet Time Documented: Forearm (Right) - 21 minutes Total: Forearm (Right) - 21 minutes    DISPOSITION:  Stable to PACU.   INDICATIONS: Patient is a 68 year old male with a history of triggering of his right thumb right ring finger which has not responded to conservative treatment including multiple injections.  He is admitted for release A1 pulley right thumb right ring finger.  Pre-peri-and postoperative course been discussed along with risks and complications.  He is aware there is no guarantee to the surgery the possibility of infection recurrence injury to arteries nerves tendons complete relief symptoms and dystrophy.  Preoperative visit the patient is seen extremity marked by both patient and surgeon antibiotic given  OPERATIVE COURSE: Patient is brought to the operating room where form based IV regional anesthetic was carried out without difficulty under the direction of the anesthesia department.  Was prepped using ChloraPrep in the supine position with the right arm free.  A three-minute dry time was allowed and a timeout taken confirming patient procedure.  The ring finger was attended to first an oblique incision was made carried down through subcutaneous tissue this was on the palm just distal to the palmar crease.  The  neurovascular structures were identified protected radially and ulnarly.  The A1 pulley was identified released on its radial aspect found to be markedly thickened.  A small incision was made in A2.  Finger placed out for passive range of motion no further triggering was noted.  The wound was irrigated and the skin closed interrupted 4-0 nylon sutures.  A transverse incision was made made over the metacarpal phalangeal joint crease of the right thumb carried down through subcutaneous tissue.  Bleeders were again electrocauterized with bipolar.  Neurovascular structures identified radially and ulnarly.  The A1 pulley was identified.  This was released on its radial aspect protect the oblique pulley tenosynovial tissue proximally was separated with blunt dissection.  The thumb placed through full range of motion no further trigger was noted.  The wound was copiously irrigated with saline.  Skin was closed with interrupted 4-0 nylon sutures.  Infiltration with quarter percent bupivacaine without epinephrine approximately 7 cc total was used.  A sterile compressive dressing with the thumb and fingers free was applied.  Inflation of the tourniquet all fingers immediately pink.  He was taken to the recovery room for observation in satisfactory condition.  He will be discharged home to return to hand center [facility] in 1 week on Tylenol ibuprofen for pain with Ultram as a backup.   Daryll Brod, MD Electronically signed, 05/15/18

## 2018-05-15 NOTE — Anesthesia Postprocedure Evaluation (Signed)
Anesthesia Post Note  Patient: Charles Sanders  Procedure(s) Performed: RELEASE TRIGGER FINGER/A-1 PULLEY RIGHT RING AND THUMB FINGERS (Right Hand)     Patient location during evaluation: PACU Anesthesia Type: Bier Block Level of consciousness: awake and alert Pain management: pain level controlled Vital Signs Assessment: post-procedure vital signs reviewed and stable Respiratory status: spontaneous breathing, nonlabored ventilation and respiratory function stable Cardiovascular status: blood pressure returned to baseline and stable Postop Assessment: no apparent nausea or vomiting Anesthetic complications: no    Last Vitals:  Vitals:   05/15/18 0930 05/15/18 0941  BP: 111/71 112/64  Pulse: 84 81  Resp: 16 (!) 25  Temp:    SpO2: 98% 99%    Last Pain:  Vitals:   05/15/18 0941  TempSrc:   PainSc: 0-No pain                 Lidia Collum

## 2018-05-15 NOTE — Discharge Instructions (Addendum)

## 2018-05-15 NOTE — Anesthesia Procedure Notes (Signed)
Anesthesia Regional Block: Bier block (IV Regional)   Pre-Anesthetic Checklist: ,, timeout performed,, Correct Site,, Correct Procedure,, site marked,,, surgical consent,, at surgeon's request  Laterality: Right  Prep: alcohol swabs       Needles:       Needle Gauge: 20     Additional Needles:   Procedures:,,,,,,,, #20gu IV placed  Narrative:  CRNA: Verita Lamb, CRNA

## 2018-05-15 NOTE — H&P (Signed)
  Charles Sanders is an 68 y.o. male.   Chief Complaint: catching thumb and ring right HPI: Charles Sanders is a 68yo male with  triggering right ring finger.  He has had this injected on 2 occasions. He states that it is beginning to bother him again starting over the past several weeks. He states it is not as painful as initial but is now 2-3 over 10. He has also complaining of his thumb occasionally a catching catching on his right side. He has no history of injury. He has a history of diabetes no history of thyroid problems arthritis gout. He has had multiple digits triggering released in the past.    Past Medical History:  Diagnosis Date  . Cancer [facility]) 2009   prostate  . Diabetes mellitus without complication ([place])    type 2  . History of kidney stones   . Hypertension   . Trigger finger of right hand    ring finger and thumb    Past Surgical History:  Procedure Laterality Date  . CARPAL TUNNEL RELEASE Left 03/27/2014   Procedure: LEFT CARPAL TUNNEL RELEASE;  Surgeon: Daryll Brod, MD;  Location: [place];  Service: Orthopedics;  Laterality: Left;  ANESTHESIA:  GENERAL, IV REGIONAL/FAB  . CARPAL TUNNEL RELEASE Right 04/29/2014   Procedure: RIGHT CARPAL TUNNEL RELEASE;  Surgeon: Daryll Brod, MD;  Location: [place];  Service: Orthopedics;  Laterality: Right;  . CHOLECYSTECTOMY    . COLONOSCOPY    . HERNIA REPAIR     x2  . KIDNEY STONE SURGERY    . PROSTATE SURGERY  2009   robotic-lap-prostatectomy  . TRIGGER FINGER RELEASE  12/14/2011   Procedure: MINOR RELEASE TRIGGER FINGER/A-1 PULLEY;  Surgeon: Cammie Sickle., MD;  Location: [place];  Service: Orthopedics;  Laterality: Left;  ring    Family History  Problem Relation Age of Onset  . Colon cancer Maternal Grandfather 60   Social History:  reports that he has been smoking cigarettes. He has been smoking about 1.50 packs per day. He has never used smokeless tobacco. He reports  current alcohol use of about 1.0 standard drinks of alcohol per week. He reports that he does not use drugs.  Allergies: No Known Allergies  No medications prior to admission.    No results found for this or any previous visit (from the past 48 hour(s)).  No results found.   Pertinent items are noted in HPI.  Height 5\' 8"  (1.727 m), weight 74.8 kg.  General appearance: alert, cooperative and appears stated age Head: Normocephalic, without obvious abnormality Neck: no JVD Resp: clear to auscultation bilaterally Cardio: regular rate and rhythm, S1, S2 normal, no murmur, click, rub or gallop GI: soft, non-tender; bowel sounds normal; no masses,  no organomegaly Extremities: catching thumb and ring right hand Pulses: 2+ and symmetric Skin: Skin color, texture, turgor normal. No rashes or lesions Neurologic: Grossly normal Incision/Wound: na  Assessment/Plan Assessment:  1. Trigger ring finger of right hand  2. Trigger finger of right thumb    Plan: He would like to go ahead and have both surgically released. Pre-peri-and postoperative course are discussed along with risks and complications. He is aware that there is no guarantee to the surgery the possibility of infection recurrence injury to her wounds nerves tendons complete relief symptoms and dystrophy. Scheduled as an outpatient under regional anesthesia.   Daryll Brod 05/15/2018, 5:50 AM

## 2018-05-16 ENCOUNTER — Encounter (HOSPITAL_BASED_OUTPATIENT_CLINIC_OR_DEPARTMENT_OTHER): Payer: Self-pay | Admitting: Orthopedic Surgery

## 2018-05-21 DIAGNOSIS — E119 Type 2 diabetes mellitus without complications: Secondary | ICD-10-CM | POA: Diagnosis not present

## 2018-05-24 ENCOUNTER — Encounter (INDEPENDENT_AMBULATORY_CARE_PROVIDER_SITE_OTHER): Payer: PPO | Admitting: Ophthalmology

## 2018-05-24 ENCOUNTER — Other Ambulatory Visit: Payer: Self-pay

## 2018-05-24 DIAGNOSIS — E113313 Type 2 diabetes mellitus with moderate nonproliferative diabetic retinopathy with macular edema, bilateral: Secondary | ICD-10-CM

## 2018-05-24 DIAGNOSIS — H43813 Vitreous degeneration, bilateral: Secondary | ICD-10-CM | POA: Diagnosis not present

## 2018-05-24 DIAGNOSIS — E11311 Type 2 diabetes mellitus with unspecified diabetic retinopathy with macular edema: Secondary | ICD-10-CM | POA: Diagnosis not present

## 2018-05-29 DIAGNOSIS — Z1331 Encounter for screening for depression: Secondary | ICD-10-CM | POA: Diagnosis not present

## 2018-05-29 DIAGNOSIS — E1139 Type 2 diabetes mellitus with other diabetic ophthalmic complication: Secondary | ICD-10-CM | POA: Diagnosis not present

## 2018-05-29 DIAGNOSIS — E785 Hyperlipidemia, unspecified: Secondary | ICD-10-CM | POA: Diagnosis not present

## 2018-05-29 DIAGNOSIS — D751 Secondary polycythemia: Secondary | ICD-10-CM | POA: Diagnosis not present

## 2018-05-29 DIAGNOSIS — N200 Calculus of kidney: Secondary | ICD-10-CM | POA: Diagnosis not present

## 2018-05-29 DIAGNOSIS — K635 Polyp of colon: Secondary | ICD-10-CM | POA: Diagnosis not present

## 2018-05-29 DIAGNOSIS — R05 Cough: Secondary | ICD-10-CM | POA: Diagnosis not present

## 2018-05-29 DIAGNOSIS — E11311 Type 2 diabetes mellitus with unspecified diabetic retinopathy with macular edema: Secondary | ICD-10-CM | POA: Diagnosis not present

## 2018-05-29 DIAGNOSIS — I739 Peripheral vascular disease, unspecified: Secondary | ICD-10-CM | POA: Diagnosis not present

## 2018-05-29 DIAGNOSIS — Z720 Tobacco use: Secondary | ICD-10-CM | POA: Diagnosis not present

## 2018-07-12 ENCOUNTER — Other Ambulatory Visit: Payer: Self-pay

## 2018-07-12 ENCOUNTER — Encounter (INDEPENDENT_AMBULATORY_CARE_PROVIDER_SITE_OTHER): Payer: PPO | Admitting: Ophthalmology

## 2018-07-12 DIAGNOSIS — E11311 Type 2 diabetes mellitus with unspecified diabetic retinopathy with macular edema: Secondary | ICD-10-CM | POA: Diagnosis not present

## 2018-07-12 DIAGNOSIS — E113313 Type 2 diabetes mellitus with moderate nonproliferative diabetic retinopathy with macular edema, bilateral: Secondary | ICD-10-CM | POA: Diagnosis not present

## 2018-07-12 DIAGNOSIS — H43813 Vitreous degeneration, bilateral: Secondary | ICD-10-CM

## 2018-08-23 ENCOUNTER — Other Ambulatory Visit: Payer: Self-pay | Admitting: Physician Assistant

## 2018-08-23 DIAGNOSIS — D225 Melanocytic nevi of trunk: Secondary | ICD-10-CM | POA: Diagnosis not present

## 2018-08-23 DIAGNOSIS — B079 Viral wart, unspecified: Secondary | ICD-10-CM | POA: Diagnosis not present

## 2018-08-23 DIAGNOSIS — D179 Benign lipomatous neoplasm, unspecified: Secondary | ICD-10-CM | POA: Diagnosis not present

## 2018-08-23 DIAGNOSIS — D229 Melanocytic nevi, unspecified: Secondary | ICD-10-CM | POA: Diagnosis not present

## 2018-08-23 DIAGNOSIS — D485 Neoplasm of uncertain behavior of skin: Secondary | ICD-10-CM | POA: Diagnosis not present

## 2018-08-30 ENCOUNTER — Encounter (INDEPENDENT_AMBULATORY_CARE_PROVIDER_SITE_OTHER): Payer: PPO | Admitting: Ophthalmology

## 2018-09-05 ENCOUNTER — Encounter (INDEPENDENT_AMBULATORY_CARE_PROVIDER_SITE_OTHER): Payer: PPO | Admitting: Ophthalmology

## 2018-09-05 ENCOUNTER — Other Ambulatory Visit: Payer: Self-pay

## 2018-09-05 DIAGNOSIS — H43813 Vitreous degeneration, bilateral: Secondary | ICD-10-CM

## 2018-09-05 DIAGNOSIS — E113313 Type 2 diabetes mellitus with moderate nonproliferative diabetic retinopathy with macular edema, bilateral: Secondary | ICD-10-CM | POA: Diagnosis not present

## 2018-09-05 DIAGNOSIS — E11311 Type 2 diabetes mellitus with unspecified diabetic retinopathy with macular edema: Secondary | ICD-10-CM

## 2018-09-05 DIAGNOSIS — H35372 Puckering of macula, left eye: Secondary | ICD-10-CM | POA: Diagnosis not present

## 2018-09-07 DIAGNOSIS — E1139 Type 2 diabetes mellitus with other diabetic ophthalmic complication: Secondary | ICD-10-CM | POA: Diagnosis not present

## 2018-09-07 DIAGNOSIS — Z125 Encounter for screening for malignant neoplasm of prostate: Secondary | ICD-10-CM | POA: Diagnosis not present

## 2018-09-13 DIAGNOSIS — R82998 Other abnormal findings in urine: Secondary | ICD-10-CM | POA: Diagnosis not present

## 2018-09-14 DIAGNOSIS — E11311 Type 2 diabetes mellitus with unspecified diabetic retinopathy with macular edema: Secondary | ICD-10-CM | POA: Diagnosis not present

## 2018-09-14 DIAGNOSIS — Z720 Tobacco use: Secondary | ICD-10-CM | POA: Diagnosis not present

## 2018-09-14 DIAGNOSIS — R05 Cough: Secondary | ICD-10-CM | POA: Diagnosis not present

## 2018-09-14 DIAGNOSIS — C61 Malignant neoplasm of prostate: Secondary | ICD-10-CM | POA: Diagnosis not present

## 2018-09-14 DIAGNOSIS — F320 Major depressive disorder, single episode, mild: Secondary | ICD-10-CM | POA: Diagnosis not present

## 2018-09-14 DIAGNOSIS — E785 Hyperlipidemia, unspecified: Secondary | ICD-10-CM | POA: Diagnosis not present

## 2018-09-14 DIAGNOSIS — Z0000 Encounter for general adult medical examination without abnormal findings: Secondary | ICD-10-CM | POA: Diagnosis not present

## 2018-09-14 DIAGNOSIS — E1139 Type 2 diabetes mellitus with other diabetic ophthalmic complication: Secondary | ICD-10-CM | POA: Diagnosis not present

## 2018-09-14 DIAGNOSIS — D696 Thrombocytopenia, unspecified: Secondary | ICD-10-CM | POA: Diagnosis not present

## 2018-09-14 DIAGNOSIS — D751 Secondary polycythemia: Secondary | ICD-10-CM | POA: Diagnosis not present

## 2018-09-15 DIAGNOSIS — Z23 Encounter for immunization: Secondary | ICD-10-CM | POA: Diagnosis not present

## 2018-09-21 DIAGNOSIS — Z1212 Encounter for screening for malignant neoplasm of rectum: Secondary | ICD-10-CM | POA: Diagnosis not present

## 2018-10-24 ENCOUNTER — Other Ambulatory Visit: Payer: Self-pay

## 2018-10-24 ENCOUNTER — Encounter (INDEPENDENT_AMBULATORY_CARE_PROVIDER_SITE_OTHER): Payer: PPO | Admitting: Ophthalmology

## 2018-10-24 DIAGNOSIS — H43813 Vitreous degeneration, bilateral: Secondary | ICD-10-CM

## 2018-10-24 DIAGNOSIS — E113313 Type 2 diabetes mellitus with moderate nonproliferative diabetic retinopathy with macular edema, bilateral: Secondary | ICD-10-CM

## 2018-10-24 DIAGNOSIS — E11311 Type 2 diabetes mellitus with unspecified diabetic retinopathy with macular edema: Secondary | ICD-10-CM

## 2018-12-03 DIAGNOSIS — S6991XA Unspecified injury of right wrist, hand and finger(s), initial encounter: Secondary | ICD-10-CM | POA: Diagnosis not present

## 2018-12-03 DIAGNOSIS — R52 Pain, unspecified: Secondary | ICD-10-CM | POA: Diagnosis not present

## 2018-12-12 ENCOUNTER — Other Ambulatory Visit: Payer: Self-pay

## 2018-12-12 ENCOUNTER — Encounter (INDEPENDENT_AMBULATORY_CARE_PROVIDER_SITE_OTHER): Payer: PPO | Admitting: Ophthalmology

## 2018-12-12 DIAGNOSIS — H43813 Vitreous degeneration, bilateral: Secondary | ICD-10-CM | POA: Diagnosis not present

## 2018-12-12 DIAGNOSIS — E11311 Type 2 diabetes mellitus with unspecified diabetic retinopathy with macular edema: Secondary | ICD-10-CM

## 2018-12-12 DIAGNOSIS — E113313 Type 2 diabetes mellitus with moderate nonproliferative diabetic retinopathy with macular edema, bilateral: Secondary | ICD-10-CM

## 2018-12-24 DIAGNOSIS — S6991XA Unspecified injury of right wrist, hand and finger(s), initial encounter: Secondary | ICD-10-CM | POA: Diagnosis not present

## 2019-01-09 DIAGNOSIS — E1139 Type 2 diabetes mellitus with other diabetic ophthalmic complication: Secondary | ICD-10-CM | POA: Diagnosis not present

## 2019-01-14 DIAGNOSIS — N281 Cyst of kidney, acquired: Secondary | ICD-10-CM | POA: Diagnosis not present

## 2019-01-14 DIAGNOSIS — Z8546 Personal history of malignant neoplasm of prostate: Secondary | ICD-10-CM | POA: Diagnosis not present

## 2019-01-14 DIAGNOSIS — D696 Thrombocytopenia, unspecified: Secondary | ICD-10-CM | POA: Diagnosis not present

## 2019-01-14 DIAGNOSIS — J432 Centrilobular emphysema: Secondary | ICD-10-CM | POA: Diagnosis not present

## 2019-01-14 DIAGNOSIS — F320 Major depressive disorder, single episode, mild: Secondary | ICD-10-CM | POA: Diagnosis not present

## 2019-01-14 DIAGNOSIS — N200 Calculus of kidney: Secondary | ICD-10-CM | POA: Diagnosis not present

## 2019-01-14 DIAGNOSIS — I700 Atherosclerosis of aorta: Secondary | ICD-10-CM | POA: Diagnosis not present

## 2019-01-14 DIAGNOSIS — E11311 Type 2 diabetes mellitus with unspecified diabetic retinopathy with macular edema: Secondary | ICD-10-CM | POA: Diagnosis not present

## 2019-01-14 DIAGNOSIS — I739 Peripheral vascular disease, unspecified: Secondary | ICD-10-CM | POA: Diagnosis not present

## 2019-01-14 DIAGNOSIS — D751 Secondary polycythemia: Secondary | ICD-10-CM | POA: Diagnosis not present

## 2019-01-14 DIAGNOSIS — E1139 Type 2 diabetes mellitus with other diabetic ophthalmic complication: Secondary | ICD-10-CM | POA: Diagnosis not present

## 2019-01-28 DIAGNOSIS — S6991XA Unspecified injury of right wrist, hand and finger(s), initial encounter: Secondary | ICD-10-CM | POA: Diagnosis not present

## 2019-01-30 ENCOUNTER — Encounter (INDEPENDENT_AMBULATORY_CARE_PROVIDER_SITE_OTHER): Payer: PPO | Admitting: Ophthalmology

## 2019-01-30 ENCOUNTER — Other Ambulatory Visit: Payer: Self-pay

## 2019-01-30 DIAGNOSIS — H43813 Vitreous degeneration, bilateral: Secondary | ICD-10-CM | POA: Diagnosis not present

## 2019-01-30 DIAGNOSIS — E113313 Type 2 diabetes mellitus with moderate nonproliferative diabetic retinopathy with macular edema, bilateral: Secondary | ICD-10-CM

## 2019-01-30 DIAGNOSIS — E11311 Type 2 diabetes mellitus with unspecified diabetic retinopathy with macular edema: Secondary | ICD-10-CM | POA: Diagnosis not present

## 2019-02-10 ENCOUNTER — Ambulatory Visit: Payer: PPO | Attending: Internal Medicine

## 2019-02-10 DIAGNOSIS — Z23 Encounter for immunization: Secondary | ICD-10-CM | POA: Insufficient documentation

## 2019-02-10 NOTE — Progress Notes (Signed)
   Covid-19 Vaccination Clinic  Name:  Charles Sanders    MRN: [id number] DOB: 1950/10/21  02/10/2019  [name] was observed post Covid-19 immunization for 15 minutes without incidence. He was provided with Vaccine Information Sheet and instruction to access the V-Safe system.   Mr. Salt was instructed to call 911 with any severe reactions post vaccine: Marland Kitchen Difficulty breathing  . Swelling of your face and throat  . A fast heartbeat  . A bad rash all over your body  . Dizziness and weakness    Immunizations Administered    Name Date Dose VIS Date Route   Pfizer COVID-19 Vaccine 02/10/2019  4:02 PM 0.3 mL 12/14/2018 Intramuscular   Manufacturer: [place]   Lot: [id number]   [place]: [id number]

## 2019-02-26 ENCOUNTER — Ambulatory Visit: Payer: PPO

## 2019-02-28 DIAGNOSIS — H35013 Changes in retinal vascular appearance, bilateral: Secondary | ICD-10-CM | POA: Diagnosis not present

## 2019-02-28 DIAGNOSIS — H35033 Hypertensive retinopathy, bilateral: Secondary | ICD-10-CM | POA: Diagnosis not present

## 2019-02-28 DIAGNOSIS — E113313 Type 2 diabetes mellitus with moderate nonproliferative diabetic retinopathy with macular edema, bilateral: Secondary | ICD-10-CM | POA: Diagnosis not present

## 2019-02-28 DIAGNOSIS — H40013 Open angle with borderline findings, low risk, bilateral: Secondary | ICD-10-CM | POA: Diagnosis not present

## 2019-03-07 ENCOUNTER — Ambulatory Visit: Payer: PPO | Attending: Internal Medicine

## 2019-03-07 DIAGNOSIS — Z23 Encounter for immunization: Secondary | ICD-10-CM | POA: Insufficient documentation

## 2019-03-07 NOTE — Progress Notes (Signed)
   Covid-19 Vaccination Clinic  Name:  Charles Sanders    MRN: [id number] DOB: 11/15/1950  03/07/2019  [name] was observed post Covid-19 immunization for 30 minutes based on pre-vaccination screening without incident. He was provided with Vaccine Information Sheet and instruction to access the V-Safe system.   [name] was instructed to call 911 with any severe reactions post vaccine: Marland Kitchen Difficulty breathing  . Swelling of face and throat  . A fast heartbeat  . A bad rash all over body  . Dizziness and weakness   Immunizations Administered    Name Date Dose VIS Date Route   Pfizer COVID-19 Vaccine 03/07/2019 11:15 AM 0.3 mL 12/14/2018 Intramuscular   Manufacturer: [place]   Lot: [id number]   [place]: [id number]

## 2019-03-20 ENCOUNTER — Other Ambulatory Visit: Payer: Self-pay

## 2019-03-20 ENCOUNTER — Encounter (INDEPENDENT_AMBULATORY_CARE_PROVIDER_SITE_OTHER): Payer: PPO | Admitting: Ophthalmology

## 2019-03-20 DIAGNOSIS — H43813 Vitreous degeneration, bilateral: Secondary | ICD-10-CM

## 2019-03-20 DIAGNOSIS — E113313 Type 2 diabetes mellitus with moderate nonproliferative diabetic retinopathy with macular edema, bilateral: Secondary | ICD-10-CM

## 2019-03-20 DIAGNOSIS — E11311 Type 2 diabetes mellitus with unspecified diabetic retinopathy with macular edema: Secondary | ICD-10-CM | POA: Diagnosis not present

## 2019-03-27 DIAGNOSIS — Z8546 Personal history of malignant neoplasm of prostate: Secondary | ICD-10-CM | POA: Diagnosis not present

## 2019-03-27 DIAGNOSIS — N5201 Erectile dysfunction due to arterial insufficiency: Secondary | ICD-10-CM | POA: Diagnosis not present

## 2019-05-09 ENCOUNTER — Encounter (INDEPENDENT_AMBULATORY_CARE_PROVIDER_SITE_OTHER): Payer: PPO | Admitting: Ophthalmology

## 2019-05-09 ENCOUNTER — Other Ambulatory Visit: Payer: Self-pay

## 2019-05-09 DIAGNOSIS — E113313 Type 2 diabetes mellitus with moderate nonproliferative diabetic retinopathy with macular edema, bilateral: Secondary | ICD-10-CM | POA: Diagnosis not present

## 2019-05-09 DIAGNOSIS — E11311 Type 2 diabetes mellitus with unspecified diabetic retinopathy with macular edema: Secondary | ICD-10-CM

## 2019-05-09 DIAGNOSIS — H43813 Vitreous degeneration, bilateral: Secondary | ICD-10-CM

## 2019-05-16 ENCOUNTER — Other Ambulatory Visit: Payer: Self-pay | Admitting: Endocrinology

## 2019-05-16 DIAGNOSIS — J432 Centrilobular emphysema: Secondary | ICD-10-CM

## 2019-05-16 DIAGNOSIS — F320 Major depressive disorder, single episode, mild: Secondary | ICD-10-CM | POA: Diagnosis not present

## 2019-05-16 DIAGNOSIS — Z8546 Personal history of malignant neoplasm of prostate: Secondary | ICD-10-CM | POA: Diagnosis not present

## 2019-05-16 DIAGNOSIS — Z1389 Encounter for screening for other disorder: Secondary | ICD-10-CM | POA: Diagnosis not present

## 2019-05-16 DIAGNOSIS — N281 Cyst of kidney, acquired: Secondary | ICD-10-CM | POA: Diagnosis not present

## 2019-05-16 DIAGNOSIS — Z720 Tobacco use: Secondary | ICD-10-CM

## 2019-05-16 DIAGNOSIS — D696 Thrombocytopenia, unspecified: Secondary | ICD-10-CM | POA: Diagnosis not present

## 2019-05-16 DIAGNOSIS — E7849 Other hyperlipidemia: Secondary | ICD-10-CM | POA: Diagnosis not present

## 2019-05-16 DIAGNOSIS — I739 Peripheral vascular disease, unspecified: Secondary | ICD-10-CM | POA: Diagnosis not present

## 2019-05-16 DIAGNOSIS — E11311 Type 2 diabetes mellitus with unspecified diabetic retinopathy with macular edema: Secondary | ICD-10-CM | POA: Diagnosis not present

## 2019-05-16 DIAGNOSIS — I700 Atherosclerosis of aorta: Secondary | ICD-10-CM | POA: Diagnosis not present

## 2019-05-16 DIAGNOSIS — E1139 Type 2 diabetes mellitus with other diabetic ophthalmic complication: Secondary | ICD-10-CM | POA: Diagnosis not present

## 2019-05-31 ENCOUNTER — Ambulatory Visit
Admission: RE | Admit: 2019-05-31 | Discharge: 2019-05-31 | Disposition: A | Discharge status: Home / Self Care | Payer: PPO | Source: Ambulatory Visit | Attending: Endocrinology | Admitting: Endocrinology

## 2019-05-31 ENCOUNTER — Other Ambulatory Visit: Payer: Self-pay

## 2019-05-31 DIAGNOSIS — J432 Centrilobular emphysema: Secondary | ICD-10-CM

## 2019-05-31 DIAGNOSIS — Z720 Tobacco use: Secondary | ICD-10-CM

## 2019-05-31 DIAGNOSIS — I700 Atherosclerosis of aorta: Secondary | ICD-10-CM

## 2019-05-31 DIAGNOSIS — Z87891 Personal history of nicotine dependence: Secondary | ICD-10-CM | POA: Diagnosis not present

## 2019-06-13 ENCOUNTER — Other Ambulatory Visit: Payer: Self-pay

## 2019-06-13 ENCOUNTER — Encounter (INDEPENDENT_AMBULATORY_CARE_PROVIDER_SITE_OTHER): Payer: PPO | Admitting: Ophthalmology

## 2019-06-13 DIAGNOSIS — E11311 Type 2 diabetes mellitus with unspecified diabetic retinopathy with macular edema: Secondary | ICD-10-CM

## 2019-06-13 DIAGNOSIS — E113211 Type 2 diabetes mellitus with mild nonproliferative diabetic retinopathy with macular edema, right eye: Secondary | ICD-10-CM | POA: Diagnosis not present

## 2019-06-13 DIAGNOSIS — E113292 Type 2 diabetes mellitus with mild nonproliferative diabetic retinopathy without macular edema, left eye: Secondary | ICD-10-CM

## 2019-06-13 DIAGNOSIS — H43813 Vitreous degeneration, bilateral: Secondary | ICD-10-CM | POA: Diagnosis not present

## 2019-07-18 ENCOUNTER — Other Ambulatory Visit: Payer: Self-pay

## 2019-07-18 ENCOUNTER — Encounter (INDEPENDENT_AMBULATORY_CARE_PROVIDER_SITE_OTHER): Payer: PPO | Admitting: Ophthalmology

## 2019-07-18 DIAGNOSIS — H43813 Vitreous degeneration, bilateral: Secondary | ICD-10-CM | POA: Diagnosis not present

## 2019-07-18 DIAGNOSIS — E11311 Type 2 diabetes mellitus with unspecified diabetic retinopathy with macular edema: Secondary | ICD-10-CM

## 2019-07-18 DIAGNOSIS — E113313 Type 2 diabetes mellitus with moderate nonproliferative diabetic retinopathy with macular edema, bilateral: Secondary | ICD-10-CM

## 2019-08-22 ENCOUNTER — Encounter (INDEPENDENT_AMBULATORY_CARE_PROVIDER_SITE_OTHER): Payer: PPO | Admitting: Ophthalmology

## 2019-08-22 ENCOUNTER — Other Ambulatory Visit: Payer: Self-pay

## 2019-08-22 DIAGNOSIS — E113213 Type 2 diabetes mellitus with mild nonproliferative diabetic retinopathy with macular edema, bilateral: Secondary | ICD-10-CM | POA: Diagnosis not present

## 2019-08-22 DIAGNOSIS — E11311 Type 2 diabetes mellitus with unspecified diabetic retinopathy with macular edema: Secondary | ICD-10-CM | POA: Diagnosis not present

## 2019-08-22 DIAGNOSIS — H43813 Vitreous degeneration, bilateral: Secondary | ICD-10-CM | POA: Diagnosis not present

## 2019-09-05 DIAGNOSIS — E785 Hyperlipidemia, unspecified: Secondary | ICD-10-CM | POA: Diagnosis not present

## 2019-09-05 DIAGNOSIS — E1139 Type 2 diabetes mellitus with other diabetic ophthalmic complication: Secondary | ICD-10-CM | POA: Diagnosis not present

## 2019-09-05 DIAGNOSIS — C61 Malignant neoplasm of prostate: Secondary | ICD-10-CM | POA: Diagnosis not present

## 2019-09-20 DIAGNOSIS — F320 Major depressive disorder, single episode, mild: Secondary | ICD-10-CM | POA: Diagnosis not present

## 2019-09-20 DIAGNOSIS — K635 Polyp of colon: Secondary | ICD-10-CM | POA: Diagnosis not present

## 2019-09-20 DIAGNOSIS — I700 Atherosclerosis of aorta: Secondary | ICD-10-CM | POA: Diagnosis not present

## 2019-09-20 DIAGNOSIS — I739 Peripheral vascular disease, unspecified: Secondary | ICD-10-CM | POA: Diagnosis not present

## 2019-09-20 DIAGNOSIS — Z8546 Personal history of malignant neoplasm of prostate: Secondary | ICD-10-CM | POA: Diagnosis not present

## 2019-09-20 DIAGNOSIS — J432 Centrilobular emphysema: Secondary | ICD-10-CM | POA: Diagnosis not present

## 2019-09-20 DIAGNOSIS — N281 Cyst of kidney, acquired: Secondary | ICD-10-CM | POA: Diagnosis not present

## 2019-09-20 DIAGNOSIS — D751 Secondary polycythemia: Secondary | ICD-10-CM | POA: Diagnosis not present

## 2019-09-20 DIAGNOSIS — Z720 Tobacco use: Secondary | ICD-10-CM | POA: Diagnosis not present

## 2019-09-20 DIAGNOSIS — C61 Malignant neoplasm of prostate: Secondary | ICD-10-CM | POA: Diagnosis not present

## 2019-09-20 DIAGNOSIS — E1139 Type 2 diabetes mellitus with other diabetic ophthalmic complication: Secondary | ICD-10-CM | POA: Diagnosis not present

## 2019-09-20 DIAGNOSIS — R82998 Other abnormal findings in urine: Secondary | ICD-10-CM | POA: Diagnosis not present

## 2019-09-20 DIAGNOSIS — Z0000 Encounter for general adult medical examination without abnormal findings: Secondary | ICD-10-CM | POA: Diagnosis not present

## 2019-09-23 ENCOUNTER — Other Ambulatory Visit: Payer: Self-pay

## 2019-09-23 ENCOUNTER — Encounter (INDEPENDENT_AMBULATORY_CARE_PROVIDER_SITE_OTHER): Payer: PPO | Admitting: Ophthalmology

## 2019-09-23 DIAGNOSIS — E113212 Type 2 diabetes mellitus with mild nonproliferative diabetic retinopathy with macular edema, left eye: Secondary | ICD-10-CM

## 2019-09-23 DIAGNOSIS — E113391 Type 2 diabetes mellitus with moderate nonproliferative diabetic retinopathy without macular edema, right eye: Secondary | ICD-10-CM

## 2019-09-23 DIAGNOSIS — E11311 Type 2 diabetes mellitus with unspecified diabetic retinopathy with macular edema: Secondary | ICD-10-CM | POA: Diagnosis not present

## 2019-09-23 DIAGNOSIS — H43813 Vitreous degeneration, bilateral: Secondary | ICD-10-CM | POA: Diagnosis not present

## 2019-09-27 ENCOUNTER — Encounter: Payer: Self-pay | Admitting: Physician Assistant

## 2019-09-27 ENCOUNTER — Other Ambulatory Visit: Payer: Self-pay

## 2019-09-27 ENCOUNTER — Ambulatory Visit: Payer: PPO | Admitting: Physician Assistant

## 2019-09-27 DIAGNOSIS — Z1283 Encounter for screening for malignant neoplasm of skin: Secondary | ICD-10-CM

## 2019-09-27 DIAGNOSIS — L918 Other hypertrophic disorders of the skin: Secondary | ICD-10-CM | POA: Diagnosis not present

## 2019-09-27 DIAGNOSIS — L821 Other seborrheic keratosis: Secondary | ICD-10-CM | POA: Diagnosis not present

## 2019-09-27 DIAGNOSIS — R21 Rash and other nonspecific skin eruption: Secondary | ICD-10-CM | POA: Diagnosis not present

## 2019-09-27 DIAGNOSIS — L570 Actinic keratosis: Secondary | ICD-10-CM | POA: Diagnosis not present

## 2019-09-27 MED ORDER — TRIAMCINOLONE ACETONIDE 0.1 % EX CREA: 1.0000 "application " | TOPICAL_CREAM | Freq: Every day | CUTANEOUS | 3 refills | Status: DC | PRN

## 2019-09-27 NOTE — Progress Notes (Deleted)
° °  Follow-Up Visit   Subjective  Charles Sanders is a 69 y.o. male who presents for the following: Annual Exam (skin check--spot on the ankles--white spot at the left nipple) and Skin Problem (spots on legs, stomach been itchy--noticed this week).   The following portions of the chart were reviewed this encounter and updated as appropriate: Tobacco   Allergies   Meds   Problems   Med Hx   Surg Hx   Fam Hx       Objective  Well appearing patient in no apparent distress; mood and affect are within normal limits.    Objective  Left cheek: Erythematous patches with gritty scale.  Objective  head to toe: No atypical nevi No signs of non-mole skin cancer.   Objective  Left areola: White crusts  Objective  Left Thigh - Anterior (2), Right Thigh - Anterior (3): Pink papules with punctate center and excoriation.  Assessment & Plan  AK (actinic keratosis) Left cheek  Destruction of lesion - Left cheek Complexity: simple   Destruction method: cryotherapy   Informed consent: discussed and consent obtained   Timeout:  patient name, date of birth, surgical site, and procedure verified Lesion destroyed using liquid nitrogen: Yes   Cryotherapy cycles:  1 Outcome: patient tolerated procedure well with no complications   Post-procedure details: wound care instructions given    Screening exam for skin cancer head to toe  Yearly skin exams  Seborrheic keratosis Left areola  Defer Ln2- observe  Rash and other nonspecific skin eruption (5) Left Thigh - Anterior (2); Right Thigh - Anterior (3)  triamcinolone cream (KENALOG) 0.1 % - Left Thigh - Anterior (2), Right Thigh - Anterior (3)   I, Devlyn Parish, PA-C, have reviewed all documentation's for this visit.  The documentation on 09/27/19 for the exam, diagnosis, procedures and orders are all accurate and complete.

## 2019-09-27 NOTE — Progress Notes (Signed)
   Follow-Up Visit   Subjective  Charles Sanders is a 69 y.o. male who presents for the following: Annual Exam (skin check--spot on the ankles--white spot at the left nipple) and Skin Problem (spots on legs, stomach been itchy--noticed this week).   The following portions of the chart were reviewed this encounter and updated as appropriate: Tobacco  Allergies  Meds  Problems  Med Hx  Surg Hx  Fam Hx      Objective  Well appearing patient in no apparent distress; mood and affect are within normal limits.  A full examination was performed including scalp, head, eyes, ears, nose, lips, neck, chest, axillae, abdomen, back, buttocks, bilateral upper extremities, bilateral lower extremities, hands, feet, fingers, toes, fingernails, and toenails. All findings within normal limits unless otherwise noted below.  Objective  Left cheek: Erythematous patches with gritty scale.  Objective  head to toe: No atypical nevi No signs of non-mole skin cancer.   Objective  Left areola: White crusts  Objective  Left Thigh - Anterior (2), Right Thigh - Anterior (3): Pink papules with punctate center and excoriation.  Objective  Right Lower Cutaneous Lip: Fleshy, skin-colored sessile and pedunculated papules.    Assessment & Plan  AK (actinic keratosis) Left cheek  Destruction of lesion - Left cheek Complexity: simple   Destruction method: cryotherapy   Informed consent: discussed and consent obtained   Timeout:  patient name, date of birth, surgical site, and procedure verified Lesion destroyed using liquid nitrogen: Yes   Cryotherapy cycles:  1 Outcome: patient tolerated procedure well with no complications   Post-procedure details: wound care instructions given    Screening exam for skin cancer head to toe  Yearly skin exams  Seborrheic keratosis Left areola  Defer Ln2- observe  Rash and other nonspecific skin eruption (5) Left Thigh - Anterior (2); Right Thigh -  Anterior (3)  triamcinolone cream (KENALOG) 0.1 % - Left Thigh - Anterior (2), Right Thigh - Anterior (3)  Skin tag Right Lower Cutaneous Lip  Destruction of lesion - Right Lower Cutaneous Lip  Destruction method comment:  Scissors were used to snip tag at the base Informed consent: discussed and consent obtained   Timeout:  patient name, date of birth, surgical site, and procedure verified Anesthesia: the lesion was anesthetized in a standard fashion   Hemostasis achieved with:  pressure Outcome: patient tolerated procedure well with no complications   Post-procedure details: wound care instructions given     I, Darlin Stenseth, PA-C, have reviewed all documentation's for this visit.  The documentation on 09/27/19 for the exam, diagnosis, procedures and orders are all accurate and complete.

## 2019-10-05 DIAGNOSIS — Z23 Encounter for immunization: Secondary | ICD-10-CM | POA: Diagnosis not present

## 2019-10-11 DIAGNOSIS — Z1212 Encounter for screening for malignant neoplasm of rectum: Secondary | ICD-10-CM | POA: Diagnosis not present

## 2019-10-14 ENCOUNTER — Encounter (INDEPENDENT_AMBULATORY_CARE_PROVIDER_SITE_OTHER): Payer: PPO | Admitting: Ophthalmology

## 2019-10-28 ENCOUNTER — Other Ambulatory Visit: Payer: Self-pay

## 2019-10-28 ENCOUNTER — Encounter (INDEPENDENT_AMBULATORY_CARE_PROVIDER_SITE_OTHER): Payer: PPO | Admitting: Ophthalmology

## 2019-10-28 DIAGNOSIS — E113213 Type 2 diabetes mellitus with mild nonproliferative diabetic retinopathy with macular edema, bilateral: Secondary | ICD-10-CM

## 2019-10-28 DIAGNOSIS — H43813 Vitreous degeneration, bilateral: Secondary | ICD-10-CM | POA: Diagnosis not present

## 2019-10-28 DIAGNOSIS — E11311 Type 2 diabetes mellitus with unspecified diabetic retinopathy with macular edema: Secondary | ICD-10-CM | POA: Diagnosis not present

## 2019-12-02 ENCOUNTER — Other Ambulatory Visit: Payer: Self-pay

## 2019-12-02 ENCOUNTER — Encounter (INDEPENDENT_AMBULATORY_CARE_PROVIDER_SITE_OTHER): Payer: PPO | Admitting: Ophthalmology

## 2019-12-02 DIAGNOSIS — H43813 Vitreous degeneration, bilateral: Secondary | ICD-10-CM | POA: Diagnosis not present

## 2019-12-02 DIAGNOSIS — E11311 Type 2 diabetes mellitus with unspecified diabetic retinopathy with macular edema: Secondary | ICD-10-CM

## 2019-12-02 DIAGNOSIS — E113313 Type 2 diabetes mellitus with moderate nonproliferative diabetic retinopathy with macular edema, bilateral: Secondary | ICD-10-CM | POA: Diagnosis not present

## 2020-01-06 ENCOUNTER — Other Ambulatory Visit: Payer: Self-pay

## 2020-01-06 ENCOUNTER — Encounter (INDEPENDENT_AMBULATORY_CARE_PROVIDER_SITE_OTHER): Payer: PPO | Admitting: Ophthalmology

## 2020-01-06 DIAGNOSIS — H43813 Vitreous degeneration, bilateral: Secondary | ICD-10-CM | POA: Diagnosis not present

## 2020-01-06 DIAGNOSIS — E113313 Type 2 diabetes mellitus with moderate nonproliferative diabetic retinopathy with macular edema, bilateral: Secondary | ICD-10-CM | POA: Diagnosis not present

## 2020-01-24 DIAGNOSIS — E083513 Diabetes mellitus due to underlying condition with proliferative diabetic retinopathy with macular edema, bilateral: Secondary | ICD-10-CM | POA: Diagnosis not present

## 2020-01-24 DIAGNOSIS — I2584 Coronary atherosclerosis due to calcified coronary lesion: Secondary | ICD-10-CM | POA: Diagnosis not present

## 2020-01-24 DIAGNOSIS — E1139 Type 2 diabetes mellitus with other diabetic ophthalmic complication: Secondary | ICD-10-CM | POA: Diagnosis not present

## 2020-01-24 DIAGNOSIS — I739 Peripheral vascular disease, unspecified: Secondary | ICD-10-CM | POA: Diagnosis not present

## 2020-01-24 DIAGNOSIS — I700 Atherosclerosis of aorta: Secondary | ICD-10-CM | POA: Diagnosis not present

## 2020-01-24 DIAGNOSIS — K635 Polyp of colon: Secondary | ICD-10-CM | POA: Diagnosis not present

## 2020-01-24 DIAGNOSIS — N281 Cyst of kidney, acquired: Secondary | ICD-10-CM | POA: Diagnosis not present

## 2020-01-24 DIAGNOSIS — J432 Centrilobular emphysema: Secondary | ICD-10-CM | POA: Diagnosis not present

## 2020-01-24 DIAGNOSIS — Z8546 Personal history of malignant neoplasm of prostate: Secondary | ICD-10-CM | POA: Diagnosis not present

## 2020-01-24 DIAGNOSIS — Z720 Tobacco use: Secondary | ICD-10-CM | POA: Diagnosis not present

## 2020-01-24 DIAGNOSIS — D751 Secondary polycythemia: Secondary | ICD-10-CM | POA: Diagnosis not present

## 2020-01-24 DIAGNOSIS — I2510 Atherosclerotic heart disease of native coronary artery without angina pectoris: Secondary | ICD-10-CM | POA: Diagnosis not present

## 2020-02-10 ENCOUNTER — Encounter (INDEPENDENT_AMBULATORY_CARE_PROVIDER_SITE_OTHER): Payer: PPO | Admitting: Ophthalmology

## 2020-02-10 ENCOUNTER — Other Ambulatory Visit: Payer: Self-pay

## 2020-02-10 DIAGNOSIS — H43813 Vitreous degeneration, bilateral: Secondary | ICD-10-CM

## 2020-02-10 DIAGNOSIS — E113311 Type 2 diabetes mellitus with moderate nonproliferative diabetic retinopathy with macular edema, right eye: Secondary | ICD-10-CM

## 2020-02-10 DIAGNOSIS — E113292 Type 2 diabetes mellitus with mild nonproliferative diabetic retinopathy without macular edema, left eye: Secondary | ICD-10-CM | POA: Diagnosis not present

## 2020-03-16 ENCOUNTER — Other Ambulatory Visit: Payer: Self-pay

## 2020-03-16 ENCOUNTER — Encounter (INDEPENDENT_AMBULATORY_CARE_PROVIDER_SITE_OTHER): Payer: PPO | Admitting: Ophthalmology

## 2020-03-16 DIAGNOSIS — H43813 Vitreous degeneration, bilateral: Secondary | ICD-10-CM

## 2020-03-16 DIAGNOSIS — E113292 Type 2 diabetes mellitus with mild nonproliferative diabetic retinopathy without macular edema, left eye: Secondary | ICD-10-CM

## 2020-03-16 DIAGNOSIS — E113311 Type 2 diabetes mellitus with moderate nonproliferative diabetic retinopathy with macular edema, right eye: Secondary | ICD-10-CM

## 2020-03-30 DIAGNOSIS — H40013 Open angle with borderline findings, low risk, bilateral: Secondary | ICD-10-CM | POA: Diagnosis not present

## 2020-04-02 DIAGNOSIS — E1139 Type 2 diabetes mellitus with other diabetic ophthalmic complication: Secondary | ICD-10-CM | POA: Diagnosis not present

## 2020-04-13 ENCOUNTER — Other Ambulatory Visit: Payer: Self-pay

## 2020-04-13 ENCOUNTER — Encounter (INDEPENDENT_AMBULATORY_CARE_PROVIDER_SITE_OTHER): Payer: PPO | Admitting: Ophthalmology

## 2020-04-13 DIAGNOSIS — E113312 Type 2 diabetes mellitus with moderate nonproliferative diabetic retinopathy with macular edema, left eye: Secondary | ICD-10-CM | POA: Diagnosis not present

## 2020-04-13 DIAGNOSIS — H43813 Vitreous degeneration, bilateral: Secondary | ICD-10-CM | POA: Diagnosis not present

## 2020-04-13 DIAGNOSIS — E113291 Type 2 diabetes mellitus with mild nonproliferative diabetic retinopathy without macular edema, right eye: Secondary | ICD-10-CM | POA: Diagnosis not present

## 2020-05-12 DIAGNOSIS — Z720 Tobacco use: Secondary | ICD-10-CM | POA: Diagnosis not present

## 2020-05-12 DIAGNOSIS — E1139 Type 2 diabetes mellitus with other diabetic ophthalmic complication: Secondary | ICD-10-CM | POA: Diagnosis not present

## 2020-05-12 DIAGNOSIS — R059 Cough, unspecified: Secondary | ICD-10-CM | POA: Diagnosis not present

## 2020-05-12 DIAGNOSIS — J40 Bronchitis, not specified as acute or chronic: Secondary | ICD-10-CM | POA: Diagnosis not present

## 2020-05-12 DIAGNOSIS — Z1152 Encounter for screening for COVID-19: Secondary | ICD-10-CM | POA: Diagnosis not present

## 2020-05-12 DIAGNOSIS — J069 Acute upper respiratory infection, unspecified: Secondary | ICD-10-CM | POA: Diagnosis not present

## 2020-05-18 ENCOUNTER — Encounter (INDEPENDENT_AMBULATORY_CARE_PROVIDER_SITE_OTHER): Payer: PPO | Admitting: Ophthalmology

## 2020-05-18 ENCOUNTER — Other Ambulatory Visit: Payer: Self-pay

## 2020-05-18 DIAGNOSIS — H43813 Vitreous degeneration, bilateral: Secondary | ICD-10-CM

## 2020-05-18 DIAGNOSIS — E113291 Type 2 diabetes mellitus with mild nonproliferative diabetic retinopathy without macular edema, right eye: Secondary | ICD-10-CM | POA: Diagnosis not present

## 2020-05-18 DIAGNOSIS — E113312 Type 2 diabetes mellitus with moderate nonproliferative diabetic retinopathy with macular edema, left eye: Secondary | ICD-10-CM

## 2020-05-19 DIAGNOSIS — D751 Secondary polycythemia: Secondary | ICD-10-CM | POA: Diagnosis not present

## 2020-05-19 DIAGNOSIS — I700 Atherosclerosis of aorta: Secondary | ICD-10-CM | POA: Diagnosis not present

## 2020-05-19 DIAGNOSIS — Z8546 Personal history of malignant neoplasm of prostate: Secondary | ICD-10-CM | POA: Diagnosis not present

## 2020-05-19 DIAGNOSIS — Z720 Tobacco use: Secondary | ICD-10-CM | POA: Diagnosis not present

## 2020-05-19 DIAGNOSIS — C61 Malignant neoplasm of prostate: Secondary | ICD-10-CM | POA: Diagnosis not present

## 2020-05-19 DIAGNOSIS — E785 Hyperlipidemia, unspecified: Secondary | ICD-10-CM | POA: Diagnosis not present

## 2020-05-19 DIAGNOSIS — I2584 Coronary atherosclerosis due to calcified coronary lesion: Secondary | ICD-10-CM | POA: Diagnosis not present

## 2020-05-19 DIAGNOSIS — G4700 Insomnia, unspecified: Secondary | ICD-10-CM | POA: Diagnosis not present

## 2020-05-19 DIAGNOSIS — F320 Major depressive disorder, single episode, mild: Secondary | ICD-10-CM | POA: Diagnosis not present

## 2020-05-19 DIAGNOSIS — J432 Centrilobular emphysema: Secondary | ICD-10-CM | POA: Diagnosis not present

## 2020-05-19 DIAGNOSIS — I2510 Atherosclerotic heart disease of native coronary artery without angina pectoris: Secondary | ICD-10-CM | POA: Diagnosis not present

## 2020-05-19 DIAGNOSIS — E1139 Type 2 diabetes mellitus with other diabetic ophthalmic complication: Secondary | ICD-10-CM | POA: Diagnosis not present

## 2020-05-25 ENCOUNTER — Other Ambulatory Visit: Payer: Self-pay | Admitting: Endocrinology

## 2020-05-25 DIAGNOSIS — J432 Centrilobular emphysema: Secondary | ICD-10-CM

## 2020-06-17 ENCOUNTER — Ambulatory Visit
Admission: RE | Admit: 2020-06-17 | Discharge: 2020-06-17 | Disposition: A | Discharge status: Home / Self Care | Payer: No Typology Code available for payment source | Source: Ambulatory Visit | Attending: Endocrinology | Admitting: Endocrinology

## 2020-06-17 ENCOUNTER — Other Ambulatory Visit: Payer: Self-pay | Admitting: Endocrinology

## 2020-06-17 DIAGNOSIS — F17210 Nicotine dependence, cigarettes, uncomplicated: Secondary | ICD-10-CM | POA: Diagnosis not present

## 2020-06-17 DIAGNOSIS — J432 Centrilobular emphysema: Secondary | ICD-10-CM

## 2020-06-29 ENCOUNTER — Encounter (INDEPENDENT_AMBULATORY_CARE_PROVIDER_SITE_OTHER): Payer: PPO | Admitting: Ophthalmology

## 2020-06-29 ENCOUNTER — Other Ambulatory Visit: Payer: Self-pay

## 2020-06-29 DIAGNOSIS — H43813 Vitreous degeneration, bilateral: Secondary | ICD-10-CM | POA: Diagnosis not present

## 2020-06-29 DIAGNOSIS — E113311 Type 2 diabetes mellitus with moderate nonproliferative diabetic retinopathy with macular edema, right eye: Secondary | ICD-10-CM | POA: Diagnosis not present

## 2020-06-29 DIAGNOSIS — E113212 Type 2 diabetes mellitus with mild nonproliferative diabetic retinopathy with macular edema, left eye: Secondary | ICD-10-CM | POA: Diagnosis not present

## 2020-08-10 ENCOUNTER — Encounter (INDEPENDENT_AMBULATORY_CARE_PROVIDER_SITE_OTHER): Payer: PPO | Admitting: Ophthalmology

## 2020-08-10 ENCOUNTER — Other Ambulatory Visit: Payer: Self-pay

## 2020-08-10 DIAGNOSIS — H43813 Vitreous degeneration, bilateral: Secondary | ICD-10-CM | POA: Diagnosis not present

## 2020-08-10 DIAGNOSIS — E113312 Type 2 diabetes mellitus with moderate nonproliferative diabetic retinopathy with macular edema, left eye: Secondary | ICD-10-CM

## 2020-08-10 DIAGNOSIS — E113291 Type 2 diabetes mellitus with mild nonproliferative diabetic retinopathy without macular edema, right eye: Secondary | ICD-10-CM

## 2020-09-21 ENCOUNTER — Encounter (INDEPENDENT_AMBULATORY_CARE_PROVIDER_SITE_OTHER): Payer: PPO | Admitting: Ophthalmology

## 2020-09-21 ENCOUNTER — Other Ambulatory Visit: Payer: Self-pay

## 2020-09-21 DIAGNOSIS — E113211 Type 2 diabetes mellitus with mild nonproliferative diabetic retinopathy with macular edema, right eye: Secondary | ICD-10-CM | POA: Diagnosis not present

## 2020-09-21 DIAGNOSIS — E113312 Type 2 diabetes mellitus with moderate nonproliferative diabetic retinopathy with macular edema, left eye: Secondary | ICD-10-CM | POA: Diagnosis not present

## 2020-09-21 DIAGNOSIS — H43813 Vitreous degeneration, bilateral: Secondary | ICD-10-CM | POA: Diagnosis not present

## 2020-09-22 DIAGNOSIS — I700 Atherosclerosis of aorta: Secondary | ICD-10-CM | POA: Diagnosis not present

## 2020-09-22 DIAGNOSIS — D751 Secondary polycythemia: Secondary | ICD-10-CM | POA: Diagnosis not present

## 2020-09-22 DIAGNOSIS — E083513 Diabetes mellitus due to underlying condition with proliferative diabetic retinopathy with macular edema, bilateral: Secondary | ICD-10-CM | POA: Diagnosis not present

## 2020-09-22 DIAGNOSIS — Z720 Tobacco use: Secondary | ICD-10-CM | POA: Diagnosis not present

## 2020-09-22 DIAGNOSIS — Z8546 Personal history of malignant neoplasm of prostate: Secondary | ICD-10-CM | POA: Diagnosis not present

## 2020-09-22 DIAGNOSIS — Z23 Encounter for immunization: Secondary | ICD-10-CM | POA: Diagnosis not present

## 2020-09-22 DIAGNOSIS — R059 Cough, unspecified: Secondary | ICD-10-CM | POA: Diagnosis not present

## 2020-09-22 DIAGNOSIS — E785 Hyperlipidemia, unspecified: Secondary | ICD-10-CM | POA: Diagnosis not present

## 2020-09-22 DIAGNOSIS — J432 Centrilobular emphysema: Secondary | ICD-10-CM | POA: Diagnosis not present

## 2020-09-22 DIAGNOSIS — I2584 Coronary atherosclerosis due to calcified coronary lesion: Secondary | ICD-10-CM | POA: Diagnosis not present

## 2020-09-22 DIAGNOSIS — I2510 Atherosclerotic heart disease of native coronary artery without angina pectoris: Secondary | ICD-10-CM | POA: Diagnosis not present

## 2020-09-22 DIAGNOSIS — D696 Thrombocytopenia, unspecified: Secondary | ICD-10-CM | POA: Diagnosis not present

## 2020-09-22 DIAGNOSIS — E1139 Type 2 diabetes mellitus with other diabetic ophthalmic complication: Secondary | ICD-10-CM | POA: Diagnosis not present

## 2020-10-15 DIAGNOSIS — H903 Sensorineural hearing loss, bilateral: Secondary | ICD-10-CM | POA: Diagnosis not present

## 2020-11-02 ENCOUNTER — Other Ambulatory Visit: Payer: Self-pay

## 2020-11-02 ENCOUNTER — Encounter (INDEPENDENT_AMBULATORY_CARE_PROVIDER_SITE_OTHER): Payer: PPO | Admitting: Ophthalmology

## 2020-11-02 DIAGNOSIS — H43813 Vitreous degeneration, bilateral: Secondary | ICD-10-CM

## 2020-11-02 DIAGNOSIS — E113213 Type 2 diabetes mellitus with mild nonproliferative diabetic retinopathy with macular edema, bilateral: Secondary | ICD-10-CM | POA: Diagnosis not present

## 2020-12-07 ENCOUNTER — Other Ambulatory Visit: Payer: Self-pay

## 2020-12-07 ENCOUNTER — Encounter (INDEPENDENT_AMBULATORY_CARE_PROVIDER_SITE_OTHER): Payer: PPO | Admitting: Ophthalmology

## 2020-12-07 DIAGNOSIS — H35372 Puckering of macula, left eye: Secondary | ICD-10-CM | POA: Diagnosis not present

## 2020-12-07 DIAGNOSIS — H43813 Vitreous degeneration, bilateral: Secondary | ICD-10-CM

## 2020-12-07 DIAGNOSIS — E113313 Type 2 diabetes mellitus with moderate nonproliferative diabetic retinopathy with macular edema, bilateral: Secondary | ICD-10-CM

## 2021-01-12 ENCOUNTER — Encounter (INDEPENDENT_AMBULATORY_CARE_PROVIDER_SITE_OTHER): Payer: PPO | Admitting: Ophthalmology

## 2021-01-12 ENCOUNTER — Other Ambulatory Visit: Payer: Self-pay

## 2021-01-12 DIAGNOSIS — E113313 Type 2 diabetes mellitus with moderate nonproliferative diabetic retinopathy with macular edema, bilateral: Secondary | ICD-10-CM | POA: Diagnosis not present

## 2021-01-12 DIAGNOSIS — H43813 Vitreous degeneration, bilateral: Secondary | ICD-10-CM

## 2021-01-19 ENCOUNTER — Encounter: Payer: Self-pay | Admitting: Physician Assistant

## 2021-01-19 ENCOUNTER — Ambulatory Visit: Payer: PPO | Admitting: Physician Assistant

## 2021-01-19 ENCOUNTER — Other Ambulatory Visit: Payer: Self-pay

## 2021-01-19 DIAGNOSIS — Z1283 Encounter for screening for malignant neoplasm of skin: Secondary | ICD-10-CM

## 2021-01-19 DIAGNOSIS — L708 Other acne: Secondary | ICD-10-CM

## 2021-01-19 MED ORDER — TRETINOIN 0.025 % EX CREA: TOPICAL_CREAM | Freq: Every day | CUTANEOUS | Status: AC

## 2021-01-25 ENCOUNTER — Encounter: Payer: Self-pay | Admitting: Physician Assistant

## 2021-01-25 NOTE — Progress Notes (Signed)
° °  Follow-Up Visit   Subjective  Charles Sanders is a 71 y.o. male who presents for the following: Annual Exam (Skin check, ). He is also having an acne flare on his face.  He has used other things in the past but needs something to help clear it.    The following portions of the chart were reviewed this encounter and updated as appropriate:  Tobacco   Allergies   Meds   Problems   Med Hx   Surg Hx   Fam Hx       Objective  Well appearing patient in no apparent distress; mood and affect are within normal limits.  A full examination was performed including scalp, head, eyes, ears, nose, lips, neck, chest, axillae, abdomen, back, buttocks, bilateral upper extremities, bilateral lower extremities, hands, feet, fingers, toes, fingernails, and toenails. All findings within normal limits unless otherwise noted below.  Head - Anterior (Face) Scattered inflammatory papules, and pustules.    Assessment & Plan  Other acne Head - Anterior (Face)  tretinoin (RETIN-A) 0.025 % cream - Head - Anterior (Face) Apply topically at bedtime.  No atypical nevi noted at the time of the visit.  I, Ronson Hagins, PA-C, have reviewed all documentation's for this visit.  The documentation on 01/25/21 for the exam, diagnosis, procedures and orders are all accurate and complete.

## 2021-01-29 DIAGNOSIS — J432 Centrilobular emphysema: Secondary | ICD-10-CM | POA: Diagnosis not present

## 2021-01-29 DIAGNOSIS — K635 Polyp of colon: Secondary | ICD-10-CM | POA: Diagnosis not present

## 2021-01-29 DIAGNOSIS — I700 Atherosclerosis of aorta: Secondary | ICD-10-CM | POA: Diagnosis not present

## 2021-01-29 DIAGNOSIS — E785 Hyperlipidemia, unspecified: Secondary | ICD-10-CM | POA: Diagnosis not present

## 2021-01-29 DIAGNOSIS — D751 Secondary polycythemia: Secondary | ICD-10-CM | POA: Diagnosis not present

## 2021-01-29 DIAGNOSIS — E1139 Type 2 diabetes mellitus with other diabetic ophthalmic complication: Secondary | ICD-10-CM | POA: Diagnosis not present

## 2021-01-29 DIAGNOSIS — Z720 Tobacco use: Secondary | ICD-10-CM | POA: Diagnosis not present

## 2021-01-29 DIAGNOSIS — I2584 Coronary atherosclerosis due to calcified coronary lesion: Secondary | ICD-10-CM | POA: Diagnosis not present

## 2021-01-29 DIAGNOSIS — E083513 Diabetes mellitus due to underlying condition with proliferative diabetic retinopathy with macular edema, bilateral: Secondary | ICD-10-CM | POA: Diagnosis not present

## 2021-01-29 DIAGNOSIS — I739 Peripheral vascular disease, unspecified: Secondary | ICD-10-CM | POA: Diagnosis not present

## 2021-01-29 DIAGNOSIS — N281 Cyst of kidney, acquired: Secondary | ICD-10-CM | POA: Diagnosis not present

## 2021-02-10 IMAGING — CT CT CHEST LUNG CANCER SCREENING LOW DOSE W/O CM
1 of 3 series · 10 of 30 positions shown, 13 images · non-contrast
Comparison: [id number]

CLINICAL DATA: 68-year-old male with 58 pack-year history of
smoking. Lung cancer screening.

EXAM:
CT CHEST WITHOUT CONTRAST LOW-DOSE FOR LUNG CANCER SCREENING
TECHNIQUE: Multidetector CT imaging of the chest was performed following the
standard protocol without IV contrast.

[ct lung segmentation data · axial · 0.70mm/px · z∈[+748,+748]mm · 10 of 343 frames shown]
[frame 1/343  mediastinal]
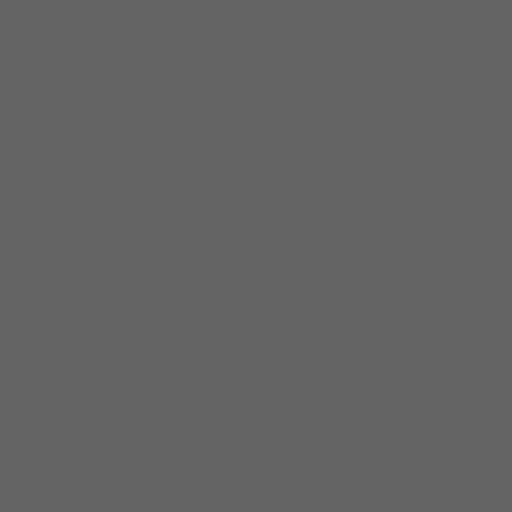
[frame 1/343  lung]
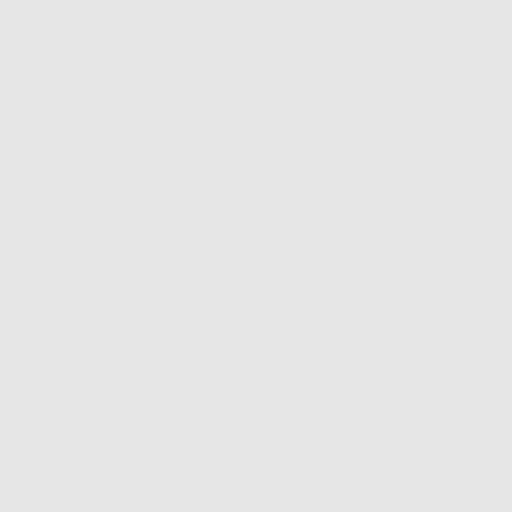
[frame 39/343  lung]
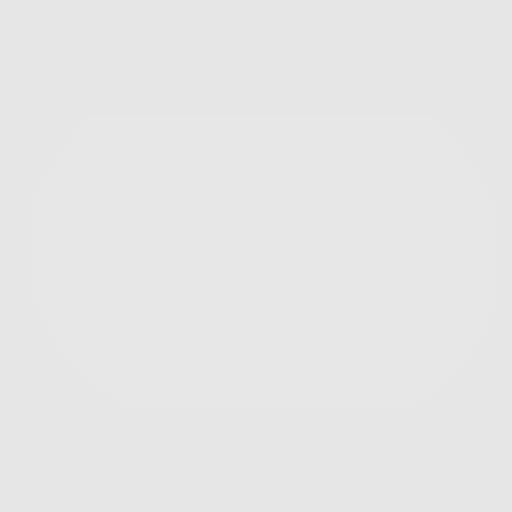
[frame 77/343  lung]
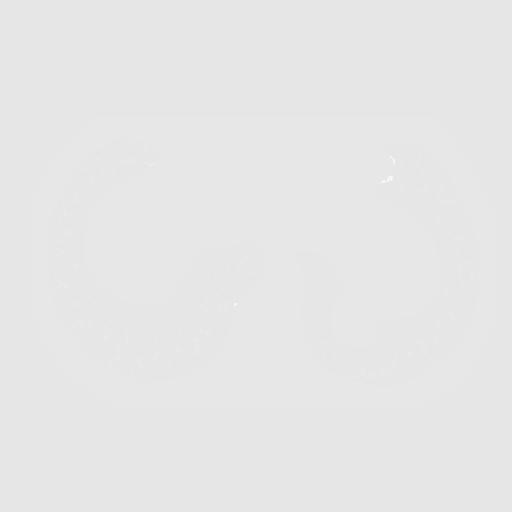
[frame 115/343  lung]
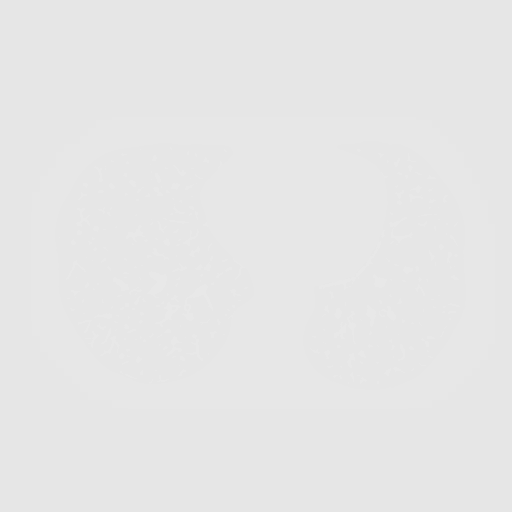
[frame 153/343  mediastinal]
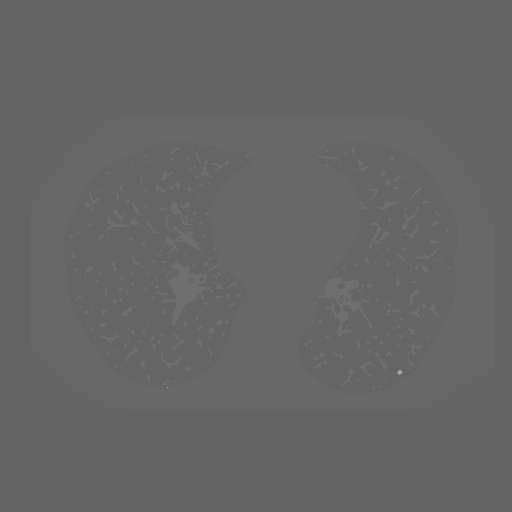
[frame 153/343  lung]
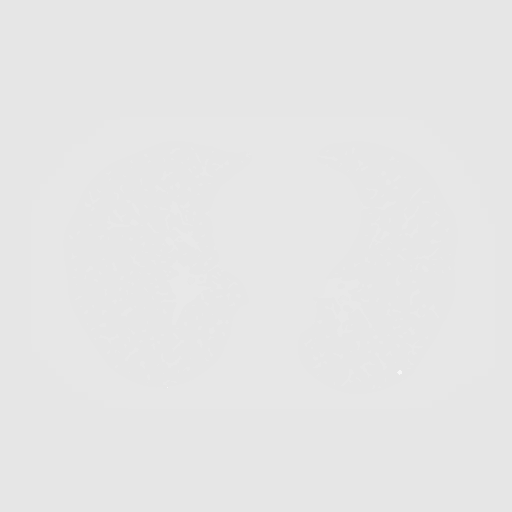
[frame 191/343  lung]
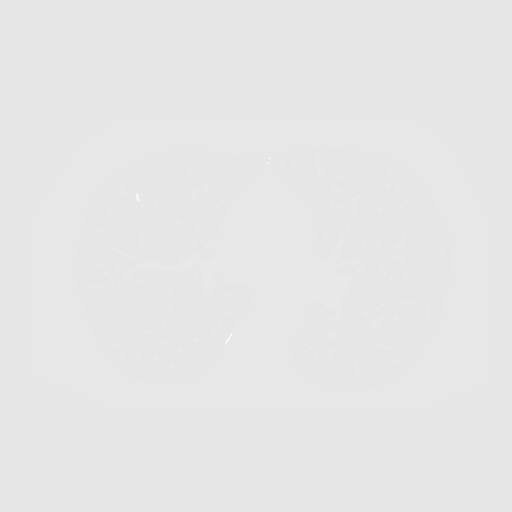
[frame 229/343  lung]
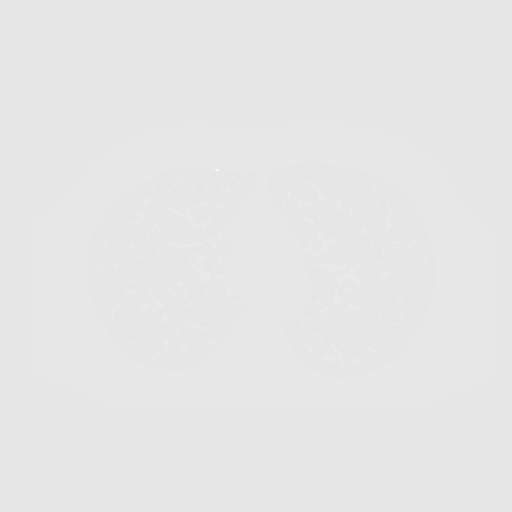
[frame 267/343  lung]
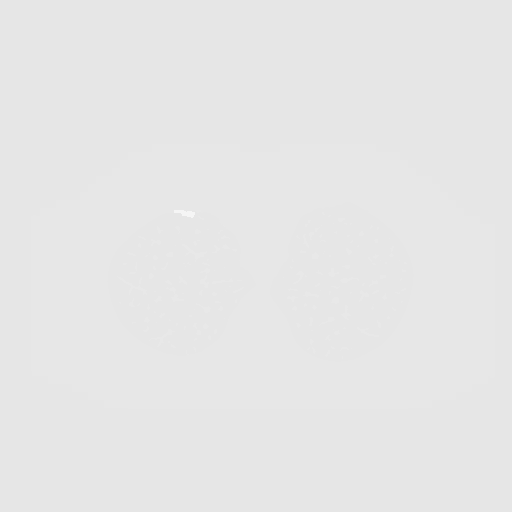
[frame 305/343  mediastinal]
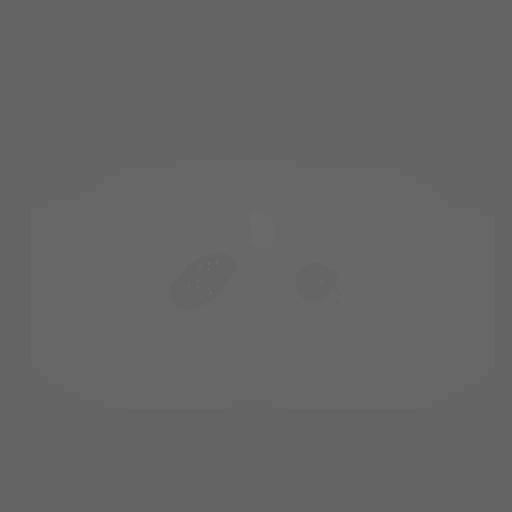
[frame 305/343  lung]
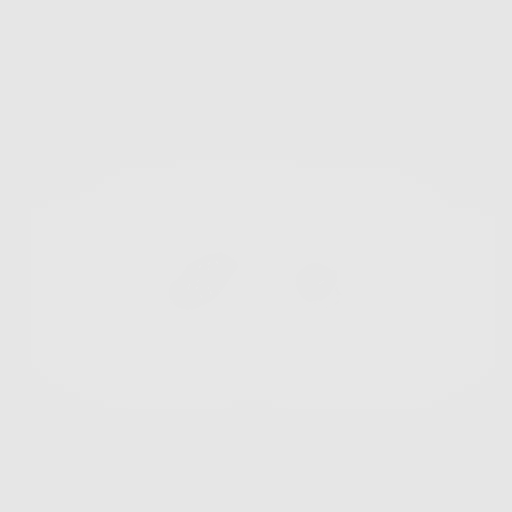
[frame 343/343  lung]
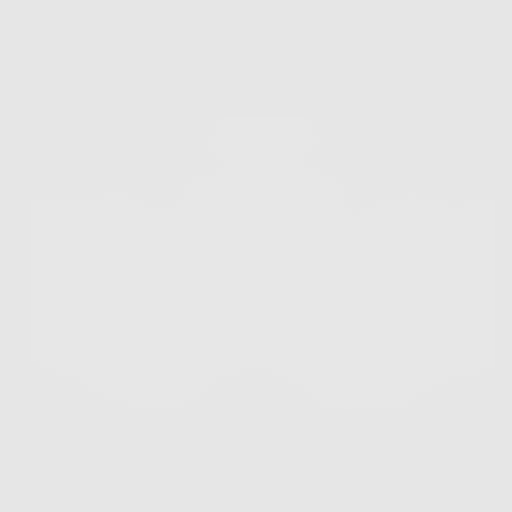

[10 of 30 positions shown; findings below may reference images not displayed]

FINDINGS: Cardiovascular: The heart size is normal. No substantial pericardial
effusion. Coronary artery calcification is evident. Atherosclerotic
calcification is noted in the wall of the thoracic aorta.

Mediastinum/Nodes: No mediastinal lymphadenopathy. No evidence for
gross hilar lymphadenopathy although assessment is limited by the
lack of intravenous contrast on today's study. The esophagus has
normal imaging features. There is no axillary lymphadenopathy.

Lungs/Pleura: Centrilobular emphsyema noted. Tiny calcified
granuloma noted left lower lobe. 2.4 mm right middle lobe nodule is
stable. No new suspicious nodule or mass. No focal airspace
consolidation. No pleural effusion.

Upper Abdomen: Stable small cyst upper pole left kidney.

Musculoskeletal: No worrisome lytic or sclerotic osseous
abnormality.
IMPRESSION: 1. Lung-RADS 2, benign appearance or behavior. Continue annual
screening with low-dose chest CT without contrast in 12 months.
2. Aortic Atherosclerosis (LUZYG-QZV.V) and Emphysema (LUZYG-N14.V).

## 2021-02-15 ENCOUNTER — Encounter (INDEPENDENT_AMBULATORY_CARE_PROVIDER_SITE_OTHER): Payer: PPO | Admitting: Ophthalmology

## 2021-02-15 ENCOUNTER — Other Ambulatory Visit: Payer: Self-pay

## 2021-02-15 DIAGNOSIS — H43813 Vitreous degeneration, bilateral: Secondary | ICD-10-CM | POA: Diagnosis not present

## 2021-02-15 DIAGNOSIS — E113313 Type 2 diabetes mellitus with moderate nonproliferative diabetic retinopathy with macular edema, bilateral: Secondary | ICD-10-CM

## 2021-03-22 ENCOUNTER — Encounter (INDEPENDENT_AMBULATORY_CARE_PROVIDER_SITE_OTHER): Payer: PPO | Admitting: Ophthalmology

## 2021-03-24 ENCOUNTER — Encounter (INDEPENDENT_AMBULATORY_CARE_PROVIDER_SITE_OTHER): Payer: PPO | Admitting: Ophthalmology

## 2021-03-24 ENCOUNTER — Other Ambulatory Visit: Payer: Self-pay

## 2021-03-24 DIAGNOSIS — E113213 Type 2 diabetes mellitus with mild nonproliferative diabetic retinopathy with macular edema, bilateral: Secondary | ICD-10-CM

## 2021-03-24 DIAGNOSIS — H43813 Vitreous degeneration, bilateral: Secondary | ICD-10-CM

## 2021-05-04 ENCOUNTER — Encounter (INDEPENDENT_AMBULATORY_CARE_PROVIDER_SITE_OTHER): Payer: PPO | Admitting: Ophthalmology

## 2021-05-04 DIAGNOSIS — H43813 Vitreous degeneration, bilateral: Secondary | ICD-10-CM

## 2021-05-04 DIAGNOSIS — E113313 Type 2 diabetes mellitus with moderate nonproliferative diabetic retinopathy with macular edema, bilateral: Secondary | ICD-10-CM | POA: Diagnosis not present

## 2021-06-15 ENCOUNTER — Encounter (INDEPENDENT_AMBULATORY_CARE_PROVIDER_SITE_OTHER): Payer: PPO | Admitting: Ophthalmology

## 2021-06-15 DIAGNOSIS — H43813 Vitreous degeneration, bilateral: Secondary | ICD-10-CM | POA: Diagnosis not present

## 2021-06-15 DIAGNOSIS — E113313 Type 2 diabetes mellitus with moderate nonproliferative diabetic retinopathy with macular edema, bilateral: Secondary | ICD-10-CM

## 2021-06-21 ENCOUNTER — Other Ambulatory Visit: Payer: Self-pay | Admitting: Endocrinology

## 2021-06-21 DIAGNOSIS — E1139 Type 2 diabetes mellitus with other diabetic ophthalmic complication: Secondary | ICD-10-CM | POA: Diagnosis not present

## 2021-06-21 DIAGNOSIS — I700 Atherosclerosis of aorta: Secondary | ICD-10-CM | POA: Diagnosis not present

## 2021-06-21 DIAGNOSIS — E785 Hyperlipidemia, unspecified: Secondary | ICD-10-CM | POA: Diagnosis not present

## 2021-06-21 DIAGNOSIS — E083513 Diabetes mellitus due to underlying condition with proliferative diabetic retinopathy with macular edema, bilateral: Secondary | ICD-10-CM | POA: Diagnosis not present

## 2021-06-21 DIAGNOSIS — D696 Thrombocytopenia, unspecified: Secondary | ICD-10-CM | POA: Diagnosis not present

## 2021-06-21 DIAGNOSIS — Z720 Tobacco use: Secondary | ICD-10-CM | POA: Diagnosis not present

## 2021-06-21 DIAGNOSIS — J432 Centrilobular emphysema: Secondary | ICD-10-CM

## 2021-06-21 DIAGNOSIS — Z8546 Personal history of malignant neoplasm of prostate: Secondary | ICD-10-CM | POA: Diagnosis not present

## 2021-06-21 DIAGNOSIS — D751 Secondary polycythemia: Secondary | ICD-10-CM | POA: Diagnosis not present

## 2021-06-21 DIAGNOSIS — I2584 Coronary atherosclerosis due to calcified coronary lesion: Secondary | ICD-10-CM | POA: Diagnosis not present

## 2021-06-21 DIAGNOSIS — G4700 Insomnia, unspecified: Secondary | ICD-10-CM | POA: Diagnosis not present

## 2021-07-20 ENCOUNTER — Ambulatory Visit
Admission: RE | Admit: 2021-07-20 | Discharge: 2021-07-20 | Disposition: A | Discharge status: Home / Self Care | Payer: PPO | Source: Ambulatory Visit | Attending: Endocrinology | Admitting: Endocrinology

## 2021-07-20 DIAGNOSIS — J432 Centrilobular emphysema: Secondary | ICD-10-CM

## 2021-07-20 DIAGNOSIS — J439 Emphysema, unspecified: Secondary | ICD-10-CM | POA: Diagnosis not present

## 2021-07-20 DIAGNOSIS — N281 Cyst of kidney, acquired: Secondary | ICD-10-CM | POA: Diagnosis not present

## 2021-07-20 DIAGNOSIS — I2510 Atherosclerotic heart disease of native coronary artery without angina pectoris: Secondary | ICD-10-CM | POA: Diagnosis not present

## 2021-07-20 DIAGNOSIS — Z87891 Personal history of nicotine dependence: Secondary | ICD-10-CM | POA: Diagnosis not present

## 2021-07-27 ENCOUNTER — Encounter (INDEPENDENT_AMBULATORY_CARE_PROVIDER_SITE_OTHER): Payer: PPO | Admitting: Ophthalmology

## 2021-07-27 DIAGNOSIS — H43813 Vitreous degeneration, bilateral: Secondary | ICD-10-CM

## 2021-07-27 DIAGNOSIS — E113313 Type 2 diabetes mellitus with moderate nonproliferative diabetic retinopathy with macular edema, bilateral: Secondary | ICD-10-CM | POA: Diagnosis not present

## 2021-07-29 ENCOUNTER — Other Ambulatory Visit: Payer: Self-pay | Admitting: Physician Assistant

## 2021-07-29 ENCOUNTER — Other Ambulatory Visit: Payer: Self-pay | Admitting: *Deleted

## 2021-07-29 DIAGNOSIS — R21 Rash and other nonspecific skin eruption: Secondary | ICD-10-CM

## 2021-07-29 MED ORDER — TRIAMCINOLONE ACETONIDE 0.1 % EX CREA: TOPICAL_CREAM | CUTANEOUS | 11 refills | Status: DC

## 2021-09-07 ENCOUNTER — Encounter (INDEPENDENT_AMBULATORY_CARE_PROVIDER_SITE_OTHER): Payer: PPO | Admitting: Ophthalmology

## 2021-09-07 DIAGNOSIS — H43813 Vitreous degeneration, bilateral: Secondary | ICD-10-CM

## 2021-09-07 DIAGNOSIS — E113313 Type 2 diabetes mellitus with moderate nonproliferative diabetic retinopathy with macular edema, bilateral: Secondary | ICD-10-CM

## 2021-09-13 DIAGNOSIS — H35033 Hypertensive retinopathy, bilateral: Secondary | ICD-10-CM | POA: Diagnosis not present

## 2021-09-13 DIAGNOSIS — H40013 Open angle with borderline findings, low risk, bilateral: Secondary | ICD-10-CM | POA: Diagnosis not present

## 2021-09-13 DIAGNOSIS — H35371 Puckering of macula, right eye: Secondary | ICD-10-CM | POA: Diagnosis not present

## 2021-09-13 DIAGNOSIS — E113313 Type 2 diabetes mellitus with moderate nonproliferative diabetic retinopathy with macular edema, bilateral: Secondary | ICD-10-CM | POA: Diagnosis not present

## 2021-09-13 DIAGNOSIS — H524 Presbyopia: Secondary | ICD-10-CM | POA: Diagnosis not present

## 2021-10-18 ENCOUNTER — Encounter (INDEPENDENT_AMBULATORY_CARE_PROVIDER_SITE_OTHER): Payer: PPO | Admitting: Ophthalmology

## 2021-10-18 DIAGNOSIS — E113212 Type 2 diabetes mellitus with mild nonproliferative diabetic retinopathy with macular edema, left eye: Secondary | ICD-10-CM

## 2021-10-18 DIAGNOSIS — H43813 Vitreous degeneration, bilateral: Secondary | ICD-10-CM

## 2021-10-18 DIAGNOSIS — E113311 Type 2 diabetes mellitus with moderate nonproliferative diabetic retinopathy with macular edema, right eye: Secondary | ICD-10-CM | POA: Diagnosis not present

## 2021-10-21 ENCOUNTER — Ambulatory Visit: Payer: PPO | Admitting: Physician Assistant

## 2021-11-04 DIAGNOSIS — R7989 Other specified abnormal findings of blood chemistry: Secondary | ICD-10-CM | POA: Diagnosis not present

## 2021-11-04 DIAGNOSIS — E785 Hyperlipidemia, unspecified: Secondary | ICD-10-CM | POA: Diagnosis not present

## 2021-11-04 DIAGNOSIS — Z125 Encounter for screening for malignant neoplasm of prostate: Secondary | ICD-10-CM | POA: Diagnosis not present

## 2021-11-04 DIAGNOSIS — E1139 Type 2 diabetes mellitus with other diabetic ophthalmic complication: Secondary | ICD-10-CM | POA: Diagnosis not present

## 2021-11-08 DIAGNOSIS — Z1212 Encounter for screening for malignant neoplasm of rectum: Secondary | ICD-10-CM | POA: Diagnosis not present

## 2021-11-11 DIAGNOSIS — G4700 Insomnia, unspecified: Secondary | ICD-10-CM | POA: Diagnosis not present

## 2021-11-11 DIAGNOSIS — I739 Peripheral vascular disease, unspecified: Secondary | ICD-10-CM | POA: Diagnosis not present

## 2021-11-11 DIAGNOSIS — I2584 Coronary atherosclerosis due to calcified coronary lesion: Secondary | ICD-10-CM | POA: Diagnosis not present

## 2021-11-11 DIAGNOSIS — J432 Centrilobular emphysema: Secondary | ICD-10-CM | POA: Diagnosis not present

## 2021-11-11 DIAGNOSIS — I700 Atherosclerosis of aorta: Secondary | ICD-10-CM | POA: Diagnosis not present

## 2021-11-11 DIAGNOSIS — R82998 Other abnormal findings in urine: Secondary | ICD-10-CM | POA: Diagnosis not present

## 2021-11-11 DIAGNOSIS — E1139 Type 2 diabetes mellitus with other diabetic ophthalmic complication: Secondary | ICD-10-CM | POA: Diagnosis not present

## 2021-11-11 DIAGNOSIS — E083513 Diabetes mellitus due to underlying condition with proliferative diabetic retinopathy with macular edema, bilateral: Secondary | ICD-10-CM | POA: Diagnosis not present

## 2021-11-11 DIAGNOSIS — Z0000 Encounter for general adult medical examination without abnormal findings: Secondary | ICD-10-CM | POA: Diagnosis not present

## 2021-11-11 DIAGNOSIS — Z8546 Personal history of malignant neoplasm of prostate: Secondary | ICD-10-CM | POA: Diagnosis not present

## 2021-11-11 DIAGNOSIS — D751 Secondary polycythemia: Secondary | ICD-10-CM | POA: Diagnosis not present

## 2021-11-11 DIAGNOSIS — Z1331 Encounter for screening for depression: Secondary | ICD-10-CM | POA: Diagnosis not present

## 2021-11-11 DIAGNOSIS — Z23 Encounter for immunization: Secondary | ICD-10-CM | POA: Diagnosis not present

## 2021-11-11 DIAGNOSIS — E785 Hyperlipidemia, unspecified: Secondary | ICD-10-CM | POA: Diagnosis not present

## 2021-11-29 ENCOUNTER — Encounter (INDEPENDENT_AMBULATORY_CARE_PROVIDER_SITE_OTHER): Payer: PPO | Admitting: Ophthalmology

## 2021-11-29 DIAGNOSIS — E113212 Type 2 diabetes mellitus with mild nonproliferative diabetic retinopathy with macular edema, left eye: Secondary | ICD-10-CM | POA: Diagnosis not present

## 2021-11-29 DIAGNOSIS — E113311 Type 2 diabetes mellitus with moderate nonproliferative diabetic retinopathy with macular edema, right eye: Secondary | ICD-10-CM

## 2021-11-29 DIAGNOSIS — H43813 Vitreous degeneration, bilateral: Secondary | ICD-10-CM

## 2022-01-11 ENCOUNTER — Encounter (INDEPENDENT_AMBULATORY_CARE_PROVIDER_SITE_OTHER): Payer: PPO | Admitting: Ophthalmology

## 2022-01-11 DIAGNOSIS — H43813 Vitreous degeneration, bilateral: Secondary | ICD-10-CM | POA: Diagnosis not present

## 2022-01-11 DIAGNOSIS — E113313 Type 2 diabetes mellitus with moderate nonproliferative diabetic retinopathy with macular edema, bilateral: Secondary | ICD-10-CM | POA: Diagnosis not present

## 2022-01-20 ENCOUNTER — Ambulatory Visit: Payer: PPO | Admitting: Physician Assistant

## 2022-02-08 ENCOUNTER — Encounter (INDEPENDENT_AMBULATORY_CARE_PROVIDER_SITE_OTHER): Payer: PPO | Admitting: Ophthalmology

## 2022-02-08 DIAGNOSIS — E113313 Type 2 diabetes mellitus with moderate nonproliferative diabetic retinopathy with macular edema, bilateral: Secondary | ICD-10-CM | POA: Diagnosis not present

## 2022-02-08 DIAGNOSIS — H43813 Vitreous degeneration, bilateral: Secondary | ICD-10-CM | POA: Diagnosis not present

## 2022-03-10 ENCOUNTER — Encounter (INDEPENDENT_AMBULATORY_CARE_PROVIDER_SITE_OTHER): Payer: PPO | Admitting: Ophthalmology

## 2022-03-10 DIAGNOSIS — H43813 Vitreous degeneration, bilateral: Secondary | ICD-10-CM | POA: Diagnosis not present

## 2022-03-10 DIAGNOSIS — E113313 Type 2 diabetes mellitus with moderate nonproliferative diabetic retinopathy with macular edema, bilateral: Secondary | ICD-10-CM

## 2022-03-14 ENCOUNTER — Encounter (INDEPENDENT_AMBULATORY_CARE_PROVIDER_SITE_OTHER): Payer: PPO | Admitting: Ophthalmology

## 2022-03-14 DIAGNOSIS — I2584 Coronary atherosclerosis due to calcified coronary lesion: Secondary | ICD-10-CM | POA: Diagnosis not present

## 2022-03-14 DIAGNOSIS — Z720 Tobacco use: Secondary | ICD-10-CM | POA: Diagnosis not present

## 2022-03-14 DIAGNOSIS — I700 Atherosclerosis of aorta: Secondary | ICD-10-CM | POA: Diagnosis not present

## 2022-03-14 DIAGNOSIS — E785 Hyperlipidemia, unspecified: Secondary | ICD-10-CM | POA: Diagnosis not present

## 2022-03-14 DIAGNOSIS — F331 Major depressive disorder, recurrent, moderate: Secondary | ICD-10-CM | POA: Diagnosis not present

## 2022-03-14 DIAGNOSIS — G4700 Insomnia, unspecified: Secondary | ICD-10-CM | POA: Diagnosis not present

## 2022-03-14 DIAGNOSIS — Z8546 Personal history of malignant neoplasm of prostate: Secondary | ICD-10-CM | POA: Diagnosis not present

## 2022-03-14 DIAGNOSIS — N281 Cyst of kidney, acquired: Secondary | ICD-10-CM | POA: Diagnosis not present

## 2022-03-14 DIAGNOSIS — E1139 Type 2 diabetes mellitus with other diabetic ophthalmic complication: Secondary | ICD-10-CM | POA: Diagnosis not present

## 2022-03-14 DIAGNOSIS — J432 Centrilobular emphysema: Secondary | ICD-10-CM | POA: Diagnosis not present

## 2022-03-14 DIAGNOSIS — K635 Polyp of colon: Secondary | ICD-10-CM | POA: Diagnosis not present

## 2022-03-14 DIAGNOSIS — E083513 Diabetes mellitus due to underlying condition with proliferative diabetic retinopathy with macular edema, bilateral: Secondary | ICD-10-CM | POA: Diagnosis not present

## 2022-03-15 ENCOUNTER — Encounter (INDEPENDENT_AMBULATORY_CARE_PROVIDER_SITE_OTHER): Payer: PPO | Admitting: Ophthalmology

## 2022-04-14 DIAGNOSIS — Z1283 Encounter for screening for malignant neoplasm of skin: Secondary | ICD-10-CM | POA: Diagnosis not present

## 2022-04-14 DIAGNOSIS — L568 Other specified acute skin changes due to ultraviolet radiation: Secondary | ICD-10-CM | POA: Diagnosis not present

## 2022-04-18 ENCOUNTER — Encounter (INDEPENDENT_AMBULATORY_CARE_PROVIDER_SITE_OTHER): Payer: PPO | Admitting: Ophthalmology

## 2022-04-18 DIAGNOSIS — H43813 Vitreous degeneration, bilateral: Secondary | ICD-10-CM | POA: Diagnosis not present

## 2022-04-18 DIAGNOSIS — E113313 Type 2 diabetes mellitus with moderate nonproliferative diabetic retinopathy with macular edema, bilateral: Secondary | ICD-10-CM

## 2022-05-18 DIAGNOSIS — J40 Bronchitis, not specified as acute or chronic: Secondary | ICD-10-CM | POA: Diagnosis not present

## 2022-05-18 DIAGNOSIS — J069 Acute upper respiratory infection, unspecified: Secondary | ICD-10-CM | POA: Diagnosis not present

## 2022-05-18 DIAGNOSIS — R051 Acute cough: Secondary | ICD-10-CM | POA: Diagnosis not present

## 2022-05-18 DIAGNOSIS — Z720 Tobacco use: Secondary | ICD-10-CM | POA: Diagnosis not present

## 2022-05-23 ENCOUNTER — Encounter (INDEPENDENT_AMBULATORY_CARE_PROVIDER_SITE_OTHER): Payer: PPO | Admitting: Ophthalmology

## 2022-05-25 ENCOUNTER — Encounter (INDEPENDENT_AMBULATORY_CARE_PROVIDER_SITE_OTHER): Payer: PPO | Admitting: Ophthalmology

## 2022-05-25 DIAGNOSIS — Z7985 Long-term (current) use of injectable non-insulin antidiabetic drugs: Secondary | ICD-10-CM | POA: Diagnosis not present

## 2022-05-25 DIAGNOSIS — E113313 Type 2 diabetes mellitus with moderate nonproliferative diabetic retinopathy with macular edema, bilateral: Secondary | ICD-10-CM

## 2022-05-25 DIAGNOSIS — H43813 Vitreous degeneration, bilateral: Secondary | ICD-10-CM

## 2022-05-25 DIAGNOSIS — Z7984 Long term (current) use of oral hypoglycemic drugs: Secondary | ICD-10-CM | POA: Diagnosis not present

## 2022-07-05 ENCOUNTER — Encounter (INDEPENDENT_AMBULATORY_CARE_PROVIDER_SITE_OTHER): Payer: PPO | Admitting: Ophthalmology

## 2022-07-05 DIAGNOSIS — H43813 Vitreous degeneration, bilateral: Secondary | ICD-10-CM

## 2022-07-05 DIAGNOSIS — Z7984 Long term (current) use of oral hypoglycemic drugs: Secondary | ICD-10-CM

## 2022-07-05 DIAGNOSIS — E113313 Type 2 diabetes mellitus with moderate nonproliferative diabetic retinopathy with macular edema, bilateral: Secondary | ICD-10-CM

## 2022-07-05 DIAGNOSIS — Z7985 Long-term (current) use of injectable non-insulin antidiabetic drugs: Secondary | ICD-10-CM | POA: Diagnosis not present

## 2022-07-12 DIAGNOSIS — Z8546 Personal history of malignant neoplasm of prostate: Secondary | ICD-10-CM | POA: Diagnosis not present

## 2022-07-12 DIAGNOSIS — I700 Atherosclerosis of aorta: Secondary | ICD-10-CM | POA: Diagnosis not present

## 2022-07-12 DIAGNOSIS — J432 Centrilobular emphysema: Secondary | ICD-10-CM | POA: Diagnosis not present

## 2022-07-12 DIAGNOSIS — D696 Thrombocytopenia, unspecified: Secondary | ICD-10-CM | POA: Diagnosis not present

## 2022-07-12 DIAGNOSIS — Z720 Tobacco use: Secondary | ICD-10-CM | POA: Diagnosis not present

## 2022-07-12 DIAGNOSIS — K635 Polyp of colon: Secondary | ICD-10-CM | POA: Diagnosis not present

## 2022-07-12 DIAGNOSIS — D751 Secondary polycythemia: Secondary | ICD-10-CM | POA: Diagnosis not present

## 2022-07-12 DIAGNOSIS — E083513 Diabetes mellitus due to underlying condition with proliferative diabetic retinopathy with macular edema, bilateral: Secondary | ICD-10-CM | POA: Diagnosis not present

## 2022-07-12 DIAGNOSIS — I739 Peripheral vascular disease, unspecified: Secondary | ICD-10-CM | POA: Diagnosis not present

## 2022-07-12 DIAGNOSIS — I2584 Coronary atherosclerosis due to calcified coronary lesion: Secondary | ICD-10-CM | POA: Diagnosis not present

## 2022-07-12 DIAGNOSIS — E1139 Type 2 diabetes mellitus with other diabetic ophthalmic complication: Secondary | ICD-10-CM | POA: Diagnosis not present

## 2022-07-12 DIAGNOSIS — E785 Hyperlipidemia, unspecified: Secondary | ICD-10-CM | POA: Diagnosis not present

## 2022-08-16 ENCOUNTER — Encounter (INDEPENDENT_AMBULATORY_CARE_PROVIDER_SITE_OTHER): Payer: PPO | Admitting: Ophthalmology

## 2022-08-16 DIAGNOSIS — Z7985 Long-term (current) use of injectable non-insulin antidiabetic drugs: Secondary | ICD-10-CM

## 2022-08-16 DIAGNOSIS — E113313 Type 2 diabetes mellitus with moderate nonproliferative diabetic retinopathy with macular edema, bilateral: Secondary | ICD-10-CM

## 2022-08-16 DIAGNOSIS — H35372 Puckering of macula, left eye: Secondary | ICD-10-CM

## 2022-08-16 DIAGNOSIS — Z7984 Long term (current) use of oral hypoglycemic drugs: Secondary | ICD-10-CM

## 2022-08-16 DIAGNOSIS — H43813 Vitreous degeneration, bilateral: Secondary | ICD-10-CM

## 2022-09-12 DIAGNOSIS — H40013 Open angle with borderline findings, low risk, bilateral: Secondary | ICD-10-CM | POA: Diagnosis not present

## 2022-09-12 DIAGNOSIS — H35371 Puckering of macula, right eye: Secondary | ICD-10-CM | POA: Diagnosis not present

## 2022-09-12 DIAGNOSIS — H524 Presbyopia: Secondary | ICD-10-CM | POA: Diagnosis not present

## 2022-09-12 DIAGNOSIS — E113313 Type 2 diabetes mellitus with moderate nonproliferative diabetic retinopathy with macular edema, bilateral: Secondary | ICD-10-CM | POA: Diagnosis not present

## 2022-09-12 DIAGNOSIS — H35033 Hypertensive retinopathy, bilateral: Secondary | ICD-10-CM | POA: Diagnosis not present

## 2022-09-27 ENCOUNTER — Encounter (INDEPENDENT_AMBULATORY_CARE_PROVIDER_SITE_OTHER): Payer: PPO | Admitting: Ophthalmology

## 2022-09-27 DIAGNOSIS — H43813 Vitreous degeneration, bilateral: Secondary | ICD-10-CM

## 2022-09-27 DIAGNOSIS — Z7985 Long-term (current) use of injectable non-insulin antidiabetic drugs: Secondary | ICD-10-CM | POA: Diagnosis not present

## 2022-09-27 DIAGNOSIS — Z7984 Long term (current) use of oral hypoglycemic drugs: Secondary | ICD-10-CM | POA: Diagnosis not present

## 2022-09-27 DIAGNOSIS — E113313 Type 2 diabetes mellitus with moderate nonproliferative diabetic retinopathy with macular edema, bilateral: Secondary | ICD-10-CM

## 2022-11-03 ENCOUNTER — Encounter (INDEPENDENT_AMBULATORY_CARE_PROVIDER_SITE_OTHER): Payer: PPO | Admitting: Ophthalmology

## 2022-11-03 DIAGNOSIS — H35372 Puckering of macula, left eye: Secondary | ICD-10-CM

## 2022-11-03 DIAGNOSIS — E113313 Type 2 diabetes mellitus with moderate nonproliferative diabetic retinopathy with macular edema, bilateral: Secondary | ICD-10-CM | POA: Diagnosis not present

## 2022-11-03 DIAGNOSIS — Z7984 Long term (current) use of oral hypoglycemic drugs: Secondary | ICD-10-CM | POA: Diagnosis not present

## 2022-11-03 DIAGNOSIS — H43813 Vitreous degeneration, bilateral: Secondary | ICD-10-CM | POA: Diagnosis not present

## 2022-11-03 DIAGNOSIS — Z7985 Long-term (current) use of injectable non-insulin antidiabetic drugs: Secondary | ICD-10-CM

## 2022-11-11 DIAGNOSIS — Z1212 Encounter for screening for malignant neoplasm of rectum: Secondary | ICD-10-CM | POA: Diagnosis not present

## 2022-11-11 DIAGNOSIS — Z0000 Encounter for general adult medical examination without abnormal findings: Secondary | ICD-10-CM | POA: Diagnosis not present

## 2022-11-11 DIAGNOSIS — E1139 Type 2 diabetes mellitus with other diabetic ophthalmic complication: Secondary | ICD-10-CM | POA: Diagnosis not present

## 2022-11-11 DIAGNOSIS — D649 Anemia, unspecified: Secondary | ICD-10-CM | POA: Diagnosis not present

## 2022-11-17 DIAGNOSIS — E1139 Type 2 diabetes mellitus with other diabetic ophthalmic complication: Secondary | ICD-10-CM | POA: Diagnosis not present

## 2022-11-17 DIAGNOSIS — R82998 Other abnormal findings in urine: Secondary | ICD-10-CM | POA: Diagnosis not present

## 2022-11-17 DIAGNOSIS — Z1212 Encounter for screening for malignant neoplasm of rectum: Secondary | ICD-10-CM | POA: Diagnosis not present

## 2022-11-17 DIAGNOSIS — Z0000 Encounter for general adult medical examination without abnormal findings: Secondary | ICD-10-CM | POA: Diagnosis not present

## 2022-11-18 DIAGNOSIS — I2584 Coronary atherosclerosis due to calcified coronary lesion: Secondary | ICD-10-CM | POA: Diagnosis not present

## 2022-11-18 DIAGNOSIS — D696 Thrombocytopenia, unspecified: Secondary | ICD-10-CM | POA: Diagnosis not present

## 2022-11-18 DIAGNOSIS — Z23 Encounter for immunization: Secondary | ICD-10-CM | POA: Diagnosis not present

## 2022-11-18 DIAGNOSIS — D509 Iron deficiency anemia, unspecified: Secondary | ICD-10-CM | POA: Diagnosis not present

## 2022-11-18 DIAGNOSIS — Z720 Tobacco use: Secondary | ICD-10-CM | POA: Diagnosis not present

## 2022-11-18 DIAGNOSIS — J432 Centrilobular emphysema: Secondary | ICD-10-CM | POA: Diagnosis not present

## 2022-11-18 DIAGNOSIS — F331 Major depressive disorder, recurrent, moderate: Secondary | ICD-10-CM | POA: Diagnosis not present

## 2022-11-18 DIAGNOSIS — E785 Hyperlipidemia, unspecified: Secondary | ICD-10-CM | POA: Diagnosis not present

## 2022-11-18 DIAGNOSIS — Z1339 Encounter for screening examination for other mental health and behavioral disorders: Secondary | ICD-10-CM | POA: Diagnosis not present

## 2022-11-18 DIAGNOSIS — Z1331 Encounter for screening for depression: Secondary | ICD-10-CM | POA: Diagnosis not present

## 2022-11-18 DIAGNOSIS — I739 Peripheral vascular disease, unspecified: Secondary | ICD-10-CM | POA: Diagnosis not present

## 2022-11-18 DIAGNOSIS — Z0000 Encounter for general adult medical examination without abnormal findings: Secondary | ICD-10-CM | POA: Diagnosis not present

## 2022-11-18 DIAGNOSIS — E083513 Diabetes mellitus due to underlying condition with proliferative diabetic retinopathy with macular edema, bilateral: Secondary | ICD-10-CM | POA: Diagnosis not present

## 2022-11-18 DIAGNOSIS — Z8546 Personal history of malignant neoplasm of prostate: Secondary | ICD-10-CM | POA: Diagnosis not present

## 2022-11-18 DIAGNOSIS — I700 Atherosclerosis of aorta: Secondary | ICD-10-CM | POA: Diagnosis not present

## 2022-11-18 DIAGNOSIS — D751 Secondary polycythemia: Secondary | ICD-10-CM | POA: Diagnosis not present

## 2022-11-21 ENCOUNTER — Other Ambulatory Visit: Payer: Self-pay | Admitting: Endocrinology

## 2022-11-21 DIAGNOSIS — Z720 Tobacco use: Secondary | ICD-10-CM

## 2022-11-24 ENCOUNTER — Encounter: Payer: Self-pay | Admitting: Endocrinology

## 2022-11-28 ENCOUNTER — Encounter: Payer: Self-pay | Admitting: Internal Medicine

## 2022-12-05 ENCOUNTER — Ambulatory Visit
Admission: RE | Admit: 2022-12-05 | Discharge: 2022-12-05 | Disposition: A | Discharge status: Home / Self Care | Payer: PPO | Source: Ambulatory Visit | Attending: Endocrinology | Admitting: Endocrinology

## 2022-12-05 DIAGNOSIS — F17210 Nicotine dependence, cigarettes, uncomplicated: Secondary | ICD-10-CM | POA: Diagnosis not present

## 2022-12-05 DIAGNOSIS — Z720 Tobacco use: Secondary | ICD-10-CM

## 2022-12-05 DIAGNOSIS — C61 Malignant neoplasm of prostate: Secondary | ICD-10-CM | POA: Diagnosis not present

## 2022-12-06 ENCOUNTER — Encounter (INDEPENDENT_AMBULATORY_CARE_PROVIDER_SITE_OTHER): Payer: PPO | Admitting: Ophthalmology

## 2022-12-06 DIAGNOSIS — E113313 Type 2 diabetes mellitus with moderate nonproliferative diabetic retinopathy with macular edema, bilateral: Secondary | ICD-10-CM | POA: Diagnosis not present

## 2022-12-06 DIAGNOSIS — Z7985 Long-term (current) use of injectable non-insulin antidiabetic drugs: Secondary | ICD-10-CM

## 2022-12-06 DIAGNOSIS — H43813 Vitreous degeneration, bilateral: Secondary | ICD-10-CM | POA: Diagnosis not present

## 2022-12-06 DIAGNOSIS — H35372 Puckering of macula, left eye: Secondary | ICD-10-CM | POA: Diagnosis not present

## 2022-12-06 DIAGNOSIS — Z7984 Long term (current) use of oral hypoglycemic drugs: Secondary | ICD-10-CM | POA: Diagnosis not present

## 2023-01-10 DIAGNOSIS — K580 Irritable bowel syndrome with diarrhea: Secondary | ICD-10-CM | POA: Diagnosis not present

## 2023-01-10 DIAGNOSIS — E876 Hypokalemia: Secondary | ICD-10-CM | POA: Diagnosis not present

## 2023-01-10 DIAGNOSIS — K5790 Diverticulosis of intestine, part unspecified, without perforation or abscess without bleeding: Secondary | ICD-10-CM | POA: Diagnosis not present

## 2023-01-10 DIAGNOSIS — R197 Diarrhea, unspecified: Secondary | ICD-10-CM | POA: Diagnosis not present

## 2023-01-16 ENCOUNTER — Encounter (INDEPENDENT_AMBULATORY_CARE_PROVIDER_SITE_OTHER): Payer: PPO | Admitting: Ophthalmology

## 2023-01-17 ENCOUNTER — Ambulatory Visit (AMBULATORY_SURGERY_CENTER): Payer: PPO | Admitting: *Deleted

## 2023-01-17 VITALS — Ht 68.0 in | Wt 148.0 lb

## 2023-01-17 DIAGNOSIS — Z1211 Encounter for screening for malignant neoplasm of colon: Secondary | ICD-10-CM

## 2023-01-17 MED ORDER — PEG 3350-KCL-NA BICARB-NACL 420 G PO SOLR: 4000.0000 mL | Freq: Once | ORAL | 0 refills | Status: AC

## 2023-01-17 NOTE — Progress Notes (Signed)
 Pt's name and DOB verified at the beginning of the pre-visit wit 2 identifiers  Pt denies any difficulty with ambulating,sitting, laying down or rolling side to side  Pt has no issues with ambulation   Pt has no issues moving head neck or swallowing  No egg or soy allergy known to patient   No issues known to pt with past sedation with any surgeries or procedures  Pt denies having issues being intubated  No FH of Malignant Hyperthermia  Pt is not on diet pills or shots  Pt is not on home 02   Pt is not on blood thinners   Pt denies issues with constipation   Pt is not on dialysis  Pt denise any abnormal heart rhythms   Pt denies any upcoming cardiac testing  Pt encouraged to use to use Singlecare or Goodrx to reduce cost   Patient's chart reviewed by Norleen Schillings CNRA prior to pre-visit and patient appropriate for the [facility].  Pre-visit completed and red dot placed by patient's name on their procedure day (on provider's schedule).  .  Visit by phone  Pt states weight is 148 lb  Instructed pt why it is important to and  to call if they have any changes in health or new medications. Directed them to the # given and on instructions.     Instructions reviewed. Pt given both [facility] main # and MD on call # prior to instructions.  Pt states understanding. Instructed to review again prior to procedure. Pt states they will.   Instructions sent by mail and by My Chart

## 2023-01-18 ENCOUNTER — Encounter (INDEPENDENT_AMBULATORY_CARE_PROVIDER_SITE_OTHER): Payer: PPO | Admitting: Ophthalmology

## 2023-01-18 DIAGNOSIS — Z7985 Long-term (current) use of injectable non-insulin antidiabetic drugs: Secondary | ICD-10-CM | POA: Diagnosis not present

## 2023-01-18 DIAGNOSIS — H35372 Puckering of macula, left eye: Secondary | ICD-10-CM | POA: Diagnosis not present

## 2023-01-18 DIAGNOSIS — E113312 Type 2 diabetes mellitus with moderate nonproliferative diabetic retinopathy with macular edema, left eye: Secondary | ICD-10-CM

## 2023-01-18 DIAGNOSIS — H43813 Vitreous degeneration, bilateral: Secondary | ICD-10-CM

## 2023-01-18 DIAGNOSIS — Z7984 Long term (current) use of oral hypoglycemic drugs: Secondary | ICD-10-CM

## 2023-01-18 DIAGNOSIS — E113211 Type 2 diabetes mellitus with mild nonproliferative diabetic retinopathy with macular edema, right eye: Secondary | ICD-10-CM | POA: Diagnosis not present

## 2023-01-24 ENCOUNTER — Encounter: Payer: Self-pay | Admitting: Internal Medicine

## 2023-01-26 ENCOUNTER — Ambulatory Visit (AMBULATORY_SURGERY_CENTER): Payer: PPO | Admitting: Internal Medicine

## 2023-01-26 ENCOUNTER — Encounter: Payer: Self-pay | Admitting: Internal Medicine

## 2023-01-26 VITALS — BP 125/71 | HR 87 | Temp 98.2°F | Resp 10 | Ht 68.0 in | Wt 148.0 lb

## 2023-01-26 DIAGNOSIS — K648 Other hemorrhoids: Secondary | ICD-10-CM

## 2023-01-26 DIAGNOSIS — E119 Type 2 diabetes mellitus without complications: Secondary | ICD-10-CM | POA: Diagnosis not present

## 2023-01-26 DIAGNOSIS — J439 Emphysema, unspecified: Secondary | ICD-10-CM | POA: Diagnosis not present

## 2023-01-26 DIAGNOSIS — Z1211 Encounter for screening for malignant neoplasm of colon: Secondary | ICD-10-CM

## 2023-01-26 DIAGNOSIS — D128 Benign neoplasm of rectum: Secondary | ICD-10-CM

## 2023-01-26 DIAGNOSIS — K621 Rectal polyp: Secondary | ICD-10-CM

## 2023-01-26 DIAGNOSIS — K5730 Diverticulosis of large intestine without perforation or abscess without bleeding: Secondary | ICD-10-CM

## 2023-01-26 MED ORDER — SODIUM CHLORIDE 0.9 % IV SOLN: 500.0000 mL | Freq: Once | INTRAVENOUS | Status: DC

## 2023-01-26 NOTE — Progress Notes (Signed)
HISTORY OF PRESENT ILLNESS:  Charles Sanders is a 73 y.o. male presents for screening colonoscopy.  Previous examinations 2005 and 2015 were negative for neoplasia  REVIEW OF SYSTEMS:  All non-GI ROS negative except for  Past Medical History:  Diagnosis Date   Cancer (HCC) 2009   prostate   Cataract    Diabetes mellitus without complication (HCC)    type 2   Emphysema of lung (HCC)    History of kidney stones    Trigger finger of right hand    ring finger and thumb    Past Surgical History:  Procedure Laterality Date   CARPAL TUNNEL RELEASE Left 03/27/2014   Procedure: LEFT CARPAL TUNNEL RELEASE;  Surgeon: Cindee Salt, MD;  Location: [facility];  Service: Orthopedics;  Laterality: Left;  ANESTHESIA:  GENERAL, IV REGIONAL/FAB   CARPAL TUNNEL RELEASE Right 04/29/2014   Procedure: RIGHT CARPAL TUNNEL RELEASE;  Surgeon: Cindee Salt, MD;  Location: [facility];  Service: Orthopedics;  Laterality: Right;   CHOLECYSTECTOMY     COLONOSCOPY     HERNIA REPAIR     x2   KIDNEY STONE SURGERY     PROSTATE SURGERY  2009   robotic-lap-prostatectomy   TRIGGER FINGER RELEASE  12/14/2011   Procedure: MINOR RELEASE TRIGGER FINGER/A-1 PULLEY;  Surgeon: Wyn Forster., MD;  Location: [facility];  Service: Orthopedics;  Laterality: Left;  ring   TRIGGER FINGER RELEASE Right 05/15/2018   Procedure: RELEASE TRIGGER FINGER/A-1 PULLEY RIGHT RING AND THUMB FINGERS;  Surgeon: Cindee Salt, MD;  Location: [facility];  Service: Orthopedics;  Laterality: Right;  FAB    Social History Charles Sanders  reports that he has been smoking cigarettes. He has never used smokeless tobacco. He reports current alcohol use of about 1.0 standard drink of alcohol per week. He reports that he does not use drugs.  family history includes Stomach cancer in his maternal grandfather.  Allergies  Allergen Reactions   Sulfamethoxazole-Trimethoprim Other (See  Comments)    Was told as a child he was allergic       PHYSICAL EXAMINATION: Vital signs: BP (!) 113/56   Pulse 100   Temp 98.2 F (36.8 C)   Ht 5\' 8"  (1.727 m)   Wt 148 lb (67.1 kg)   SpO2 100%   BMI 22.50 kg/m  General: Well-developed, well-nourished, no acute distress HEENT: Sclerae are anicteric, conjunctiva pink. Oral mucosa intact Lungs: Clear Heart: Regular Abdomen: soft, nontender, nondistended, no obvious ascites, no peritoneal signs, normal bowel sounds. No organomegaly. Extremities: No edema Psychiatric: alert and oriented x3. Cooperative     ASSESSMENT:   Colon cancer screening  PLAN:  Screening colonoscopy

## 2023-01-26 NOTE — Patient Instructions (Signed)
Resume previous diet and medications.  Follow up colonoscopy is not recommended at this time.   YOU HAD AN ENDOSCOPIC PROCEDURE TODAY AT THE [facility]:   Refer to the procedure report that was given to you for any specific questions about what was found during the examination.  If the procedure report does not answer your questions, please call your gastroenterologist to clarify.  If you requested that your care partner not be given the details of your procedure findings, then the procedure report has been included in a sealed envelope for you to review at your convenience later.  YOU SHOULD EXPECT: Some feelings of bloating in the abdomen. Passage of more gas than usual.  Walking can help get rid of the air that was put into your GI tract during the procedure and reduce the bloating. If you had a lower endoscopy (such as a colonoscopy or flexible sigmoidoscopy) you may notice spotting of blood in your stool or on the toilet paper. If you underwent a bowel prep for your procedure, you may not have a normal bowel movement for a few days.  Please Note:  You might notice some irritation and congestion in your nose or some drainage.  This is from the oxygen used during your procedure.  There is no need for concern and it should clear up in a day or so.  SYMPTOMS TO REPORT IMMEDIATELY:  Following lower endoscopy (colonoscopy or flexible sigmoidoscopy):  Excessive amounts of blood in the stool  Significant tenderness or worsening of abdominal pains  Swelling of the abdomen that is new, acute  Fever of 100F or higher  For urgent or emergent issues, a gastroenterologist can be reached at any hour by calling [phone]. Do not use MyChart messaging for urgent concerns.    DIET:  We do recommend a small meal at first, but then you may proceed to your regular diet.  Drink plenty of fluids but you should avoid alcoholic beverages for 24 hours.  ACTIVITY:  You should plan to take it easy  for the rest of today and you should NOT DRIVE or use heavy machinery until tomorrow (because of the sedation medicines used during the test).    FOLLOW UP: Our staff will call the number listed on your records the next business day following your procedure.  We will call around 7:15- 8:00 am to check on you and address any questions or concerns that you may have regarding the information given to you following your procedure. If we do not reach you, we will leave a message.     If any biopsies were taken you will be contacted by phone or by letter within the next 1-3 weeks.  Please call us at [phone] if you have not heard about the biopsies in 3 weeks.    SIGNATURES/CONFIDENTIALITY: You and/or your care partner have signed paperwork which will be entered into your electronic medical record.  These signatures attest to the fact that that the information above on your After Visit Summary has been reviewed and is understood.  Full responsibility of the confidentiality of this discharge information lies with you and/or your care-partner.

## 2023-01-26 NOTE — Op Note (Signed)
[facility] Patient Name: Charles Sanders Procedure Date: 01/26/2023 9:17 AM MRN: [id number] Endoscopist: Wilhemina Bonito. Marina Goodell , MD, [id number] Age: 73 Referring MD:  Date of Birth: May 27, 1950 Gender: Male Account #: [id number] Procedure:                Colonoscopy with cold snare polypectomy x 1 Indications:              Screening for colorectal malignant neoplasm.                            Previous examinations with Dr. Leone Payor 2005 and                            2015 were negative for neoplasia Medicines:                Monitored Anesthesia Care Procedure:                Pre-Anesthesia Assessment:                           - Prior to the procedure, a History and Physical                            was performed, and patient medications and                            allergies were reviewed. The patient's tolerance of                            previous anesthesia was also reviewed. The risks                            and benefits of the procedure and the sedation                            options and risks were discussed with the patient.                            All questions were answered, and informed consent                            was obtained. Prior Anticoagulants: The patient has                            taken no anticoagulant or antiplatelet agents. ASA                            Grade Assessment: II - A patient with mild systemic                            disease. After reviewing the risks and benefits,                            the patient was deemed in satisfactory condition to  undergo the procedure.                           After obtaining informed consent, the colonoscope                            was passed under direct vision. Throughout the                            procedure, the patient's blood pressure, pulse, and                            oxygen saturations were monitored continuously. The                             Olympus Scope SN [id number] was introduced through the                            anus and advanced to the the cecum, identified by                            appendiceal orifice and ileocecal valve. The                            ileocecal valve, appendiceal orifice, and rectum                            were photographed. The quality of the bowel                            preparation was excellent. The colonoscopy was                            performed without difficulty. The patient tolerated                            the procedure well. The bowel preparation used was                            SUPREP via split dose instruction. Scope In: 9:31:12 AM Scope Out: 9:44:07 AM Scope Withdrawal Time: 0 hours 9 minutes 34 seconds  Total Procedure Duration: 0 hours 12 minutes 55 seconds  Findings:                 A 5 mm polyp was found in the rectum. The polyp was                            sessile. The polyp was removed with a cold snare.                            Resection and retrieval were complete.                           Many diverticula were found in the sigmoid colon.  Internal hemorrhoids were found during                            retroflexion. The hemorrhoids were small.                           The exam was otherwise without abnormality on                            direct and retroflexion views. Complications:            No immediate complications. Estimated blood loss:                            None. Estimated Blood Loss:     Estimated blood loss: none. Impression:               - One 5 mm polyp in the rectum, removed with a cold                            snare. Resected and retrieved.                           - Diverticulosis in the sigmoid colon.                           - Internal hemorrhoids.                           - The examination was otherwise normal on direct                            and retroflexion views. Recommendation:            - Repeat colonoscopy is not recommended for                            surveillance.                           - Patient has a contact number available for                            emergencies. The signs and symptoms of potential                            delayed complications were discussed with the                            patient. Return to normal activities tomorrow.                            Written discharge instructions were provided to the                            patient.                           -  Resume previous diet.                           - Continue present medications.                           - Await pathology results. Wilhemina Bonito. Marina Goodell, MD 01/26/2023 9:48:32 AM This report has been signed electronically.

## 2023-01-26 NOTE — Progress Notes (Signed)
Called to room to assist during endoscopic procedure.  Patient ID and intended procedure confirmed with present staff. Received instructions for my participation in the procedure from the performing physician.  

## 2023-01-26 NOTE — Progress Notes (Signed)
Sedate, gd SR, tolerated procedure well, VSS, report to RN 

## 2023-01-26 NOTE — Progress Notes (Signed)
Pt's states no medical or surgical changes since previsit or office visit. 

## 2023-01-27 ENCOUNTER — Telehealth: Payer: Self-pay

## 2023-01-27 NOTE — Telephone Encounter (Signed)
  Follow up Call-     01/26/2023    7:48 AM  Call back number  Post procedure Call Back phone  # [phone]  Permission to leave phone message Yes     Patient questions:  Do you have a fever, pain , or abdominal swelling? No. Pain Score  0 *  Have you tolerated food without any problems? Yes.    Have you been able to return to your normal activities? Yes.    Do you have any questions about your discharge instructions: Diet   No. Medications  No. Follow up visit  No.  Do you have questions or concerns about your Care? No.  Actions: * If pain score is 4 or above: No action needed, pain <4.

## 2023-01-30 ENCOUNTER — Encounter: Payer: Self-pay | Admitting: Internal Medicine

## 2023-01-30 LAB — SURGICAL PATHOLOGY

## 2023-02-28 ENCOUNTER — Encounter (INDEPENDENT_AMBULATORY_CARE_PROVIDER_SITE_OTHER): Payer: PPO | Admitting: Ophthalmology

## 2023-02-28 DIAGNOSIS — H43813 Vitreous degeneration, bilateral: Secondary | ICD-10-CM

## 2023-02-28 DIAGNOSIS — Z7984 Long term (current) use of oral hypoglycemic drugs: Secondary | ICD-10-CM | POA: Diagnosis not present

## 2023-02-28 DIAGNOSIS — E113312 Type 2 diabetes mellitus with moderate nonproliferative diabetic retinopathy with macular edema, left eye: Secondary | ICD-10-CM | POA: Diagnosis not present

## 2023-02-28 DIAGNOSIS — Z7985 Long-term (current) use of injectable non-insulin antidiabetic drugs: Secondary | ICD-10-CM

## 2023-02-28 DIAGNOSIS — E113291 Type 2 diabetes mellitus with mild nonproliferative diabetic retinopathy without macular edema, right eye: Secondary | ICD-10-CM

## 2023-03-23 DIAGNOSIS — H40013 Open angle with borderline findings, low risk, bilateral: Secondary | ICD-10-CM | POA: Diagnosis not present

## 2023-03-30 DIAGNOSIS — I700 Atherosclerosis of aorta: Secondary | ICD-10-CM | POA: Diagnosis not present

## 2023-03-30 DIAGNOSIS — Z8546 Personal history of malignant neoplasm of prostate: Secondary | ICD-10-CM | POA: Diagnosis not present

## 2023-03-30 DIAGNOSIS — J432 Centrilobular emphysema: Secondary | ICD-10-CM | POA: Diagnosis not present

## 2023-03-30 DIAGNOSIS — E1139 Type 2 diabetes mellitus with other diabetic ophthalmic complication: Secondary | ICD-10-CM | POA: Diagnosis not present

## 2023-03-30 DIAGNOSIS — E083513 Diabetes mellitus due to underlying condition with proliferative diabetic retinopathy with macular edema, bilateral: Secondary | ICD-10-CM | POA: Diagnosis not present

## 2023-03-30 DIAGNOSIS — D696 Thrombocytopenia, unspecified: Secondary | ICD-10-CM | POA: Diagnosis not present

## 2023-03-30 DIAGNOSIS — K635 Polyp of colon: Secondary | ICD-10-CM | POA: Diagnosis not present

## 2023-03-30 DIAGNOSIS — I739 Peripheral vascular disease, unspecified: Secondary | ICD-10-CM | POA: Diagnosis not present

## 2023-03-30 DIAGNOSIS — D751 Secondary polycythemia: Secondary | ICD-10-CM | POA: Diagnosis not present

## 2023-03-30 DIAGNOSIS — D509 Iron deficiency anemia, unspecified: Secondary | ICD-10-CM | POA: Diagnosis not present

## 2023-03-30 DIAGNOSIS — I2584 Coronary atherosclerosis due to calcified coronary lesion: Secondary | ICD-10-CM | POA: Diagnosis not present

## 2023-03-30 DIAGNOSIS — F331 Major depressive disorder, recurrent, moderate: Secondary | ICD-10-CM | POA: Diagnosis not present

## 2023-04-11 ENCOUNTER — Encounter (INDEPENDENT_AMBULATORY_CARE_PROVIDER_SITE_OTHER): Payer: PPO | Admitting: Ophthalmology

## 2023-04-11 DIAGNOSIS — E113312 Type 2 diabetes mellitus with moderate nonproliferative diabetic retinopathy with macular edema, left eye: Secondary | ICD-10-CM

## 2023-04-11 DIAGNOSIS — H43813 Vitreous degeneration, bilateral: Secondary | ICD-10-CM | POA: Diagnosis not present

## 2023-04-11 DIAGNOSIS — E113391 Type 2 diabetes mellitus with moderate nonproliferative diabetic retinopathy without macular edema, right eye: Secondary | ICD-10-CM | POA: Diagnosis not present

## 2023-04-11 DIAGNOSIS — Z7984 Long term (current) use of oral hypoglycemic drugs: Secondary | ICD-10-CM

## 2023-04-11 DIAGNOSIS — Z7985 Long-term (current) use of injectable non-insulin antidiabetic drugs: Secondary | ICD-10-CM | POA: Diagnosis not present

## 2023-05-23 ENCOUNTER — Encounter (INDEPENDENT_AMBULATORY_CARE_PROVIDER_SITE_OTHER): Admitting: Ophthalmology

## 2023-05-23 DIAGNOSIS — E113391 Type 2 diabetes mellitus with moderate nonproliferative diabetic retinopathy without macular edema, right eye: Secondary | ICD-10-CM | POA: Diagnosis not present

## 2023-05-23 DIAGNOSIS — Z7984 Long term (current) use of oral hypoglycemic drugs: Secondary | ICD-10-CM | POA: Diagnosis not present

## 2023-05-23 DIAGNOSIS — H43813 Vitreous degeneration, bilateral: Secondary | ICD-10-CM | POA: Diagnosis not present

## 2023-05-23 DIAGNOSIS — H35372 Puckering of macula, left eye: Secondary | ICD-10-CM

## 2023-05-23 DIAGNOSIS — Z7985 Long-term (current) use of injectable non-insulin antidiabetic drugs: Secondary | ICD-10-CM | POA: Diagnosis not present

## 2023-05-23 DIAGNOSIS — E113312 Type 2 diabetes mellitus with moderate nonproliferative diabetic retinopathy with macular edema, left eye: Secondary | ICD-10-CM | POA: Diagnosis not present

## 2023-07-04 ENCOUNTER — Encounter (INDEPENDENT_AMBULATORY_CARE_PROVIDER_SITE_OTHER): Admitting: Ophthalmology

## 2023-07-04 DIAGNOSIS — Z7985 Long-term (current) use of injectable non-insulin antidiabetic drugs: Secondary | ICD-10-CM | POA: Diagnosis not present

## 2023-07-04 DIAGNOSIS — Z7984 Long term (current) use of oral hypoglycemic drugs: Secondary | ICD-10-CM

## 2023-07-04 DIAGNOSIS — E113313 Type 2 diabetes mellitus with moderate nonproliferative diabetic retinopathy with macular edema, bilateral: Secondary | ICD-10-CM

## 2023-07-04 DIAGNOSIS — H43813 Vitreous degeneration, bilateral: Secondary | ICD-10-CM

## 2023-07-17 DIAGNOSIS — I700 Atherosclerosis of aorta: Secondary | ICD-10-CM | POA: Diagnosis not present

## 2023-07-17 DIAGNOSIS — Z720 Tobacco use: Secondary | ICD-10-CM | POA: Diagnosis not present

## 2023-07-17 DIAGNOSIS — I739 Peripheral vascular disease, unspecified: Secondary | ICD-10-CM | POA: Diagnosis not present

## 2023-07-17 DIAGNOSIS — D509 Iron deficiency anemia, unspecified: Secondary | ICD-10-CM | POA: Diagnosis not present

## 2023-07-17 DIAGNOSIS — E1139 Type 2 diabetes mellitus with other diabetic ophthalmic complication: Secondary | ICD-10-CM | POA: Diagnosis not present

## 2023-07-17 DIAGNOSIS — I2584 Coronary atherosclerosis due to calcified coronary lesion: Secondary | ICD-10-CM | POA: Diagnosis not present

## 2023-07-17 DIAGNOSIS — G4700 Insomnia, unspecified: Secondary | ICD-10-CM | POA: Diagnosis not present

## 2023-07-17 DIAGNOSIS — J432 Centrilobular emphysema: Secondary | ICD-10-CM | POA: Diagnosis not present

## 2023-07-17 DIAGNOSIS — Z8546 Personal history of malignant neoplasm of prostate: Secondary | ICD-10-CM | POA: Diagnosis not present

## 2023-07-17 DIAGNOSIS — F331 Major depressive disorder, recurrent, moderate: Secondary | ICD-10-CM | POA: Diagnosis not present

## 2023-07-17 DIAGNOSIS — E083513 Diabetes mellitus due to underlying condition with proliferative diabetic retinopathy with macular edema, bilateral: Secondary | ICD-10-CM | POA: Diagnosis not present

## 2023-07-17 DIAGNOSIS — K635 Polyp of colon: Secondary | ICD-10-CM | POA: Diagnosis not present

## 2023-08-15 ENCOUNTER — Encounter (INDEPENDENT_AMBULATORY_CARE_PROVIDER_SITE_OTHER): Admitting: Ophthalmology

## 2023-08-15 DIAGNOSIS — Z7985 Long-term (current) use of injectable non-insulin antidiabetic drugs: Secondary | ICD-10-CM | POA: Diagnosis not present

## 2023-08-15 DIAGNOSIS — E113312 Type 2 diabetes mellitus with moderate nonproliferative diabetic retinopathy with macular edema, left eye: Secondary | ICD-10-CM | POA: Diagnosis not present

## 2023-08-15 DIAGNOSIS — E113391 Type 2 diabetes mellitus with moderate nonproliferative diabetic retinopathy without macular edema, right eye: Secondary | ICD-10-CM

## 2023-08-15 DIAGNOSIS — Z7984 Long term (current) use of oral hypoglycemic drugs: Secondary | ICD-10-CM

## 2023-08-15 DIAGNOSIS — H43813 Vitreous degeneration, bilateral: Secondary | ICD-10-CM | POA: Diagnosis not present

## 2023-09-22 ENCOUNTER — Encounter (INDEPENDENT_AMBULATORY_CARE_PROVIDER_SITE_OTHER): Admitting: Ophthalmology

## 2023-09-22 DIAGNOSIS — Z7985 Long-term (current) use of injectable non-insulin antidiabetic drugs: Secondary | ICD-10-CM

## 2023-09-22 DIAGNOSIS — Z7984 Long term (current) use of oral hypoglycemic drugs: Secondary | ICD-10-CM

## 2023-09-22 DIAGNOSIS — E113313 Type 2 diabetes mellitus with moderate nonproliferative diabetic retinopathy with macular edema, bilateral: Secondary | ICD-10-CM

## 2023-09-22 DIAGNOSIS — H43813 Vitreous degeneration, bilateral: Secondary | ICD-10-CM

## 2023-09-26 DIAGNOSIS — H40013 Open angle with borderline findings, low risk, bilateral: Secondary | ICD-10-CM | POA: Diagnosis not present

## 2023-09-26 DIAGNOSIS — E113391 Type 2 diabetes mellitus with moderate nonproliferative diabetic retinopathy without macular edema, right eye: Secondary | ICD-10-CM | POA: Diagnosis not present

## 2023-09-26 DIAGNOSIS — H35371 Puckering of macula, right eye: Secondary | ICD-10-CM | POA: Diagnosis not present

## 2023-09-26 DIAGNOSIS — H524 Presbyopia: Secondary | ICD-10-CM | POA: Diagnosis not present

## 2023-09-26 DIAGNOSIS — E113312 Type 2 diabetes mellitus with moderate nonproliferative diabetic retinopathy with macular edema, left eye: Secondary | ICD-10-CM | POA: Diagnosis not present

## 2023-10-16 DIAGNOSIS — L814 Other melanin hyperpigmentation: Secondary | ICD-10-CM | POA: Diagnosis not present

## 2023-10-16 DIAGNOSIS — D1801 Hemangioma of skin and subcutaneous tissue: Secondary | ICD-10-CM | POA: Diagnosis not present

## 2023-10-16 DIAGNOSIS — L821 Other seborrheic keratosis: Secondary | ICD-10-CM | POA: Diagnosis not present

## 2023-10-16 DIAGNOSIS — D2262 Melanocytic nevi of left upper limb, including shoulder: Secondary | ICD-10-CM | POA: Diagnosis not present

## 2023-10-16 DIAGNOSIS — L570 Actinic keratosis: Secondary | ICD-10-CM | POA: Diagnosis not present

## 2023-10-16 DIAGNOSIS — L280 Lichen simplex chronicus: Secondary | ICD-10-CM | POA: Diagnosis not present

## 2023-10-16 DIAGNOSIS — D225 Melanocytic nevi of trunk: Secondary | ICD-10-CM | POA: Diagnosis not present

## 2023-10-31 ENCOUNTER — Encounter (INDEPENDENT_AMBULATORY_CARE_PROVIDER_SITE_OTHER): Admitting: Ophthalmology

## 2023-10-31 DIAGNOSIS — E113211 Type 2 diabetes mellitus with mild nonproliferative diabetic retinopathy with macular edema, right eye: Secondary | ICD-10-CM | POA: Diagnosis not present

## 2023-10-31 DIAGNOSIS — Z7985 Long-term (current) use of injectable non-insulin antidiabetic drugs: Secondary | ICD-10-CM

## 2023-10-31 DIAGNOSIS — E113312 Type 2 diabetes mellitus with moderate nonproliferative diabetic retinopathy with macular edema, left eye: Secondary | ICD-10-CM | POA: Diagnosis not present

## 2023-10-31 DIAGNOSIS — Z7984 Long term (current) use of oral hypoglycemic drugs: Secondary | ICD-10-CM | POA: Diagnosis not present

## 2023-10-31 DIAGNOSIS — H43813 Vitreous degeneration, bilateral: Secondary | ICD-10-CM | POA: Diagnosis not present

## 2023-11-20 DIAGNOSIS — Z1212 Encounter for screening for malignant neoplasm of rectum: Secondary | ICD-10-CM | POA: Diagnosis not present

## 2023-11-20 DIAGNOSIS — D509 Iron deficiency anemia, unspecified: Secondary | ICD-10-CM | POA: Diagnosis not present

## 2023-11-28 DIAGNOSIS — R82998 Other abnormal findings in urine: Secondary | ICD-10-CM | POA: Diagnosis not present

## 2023-11-28 DIAGNOSIS — Z23 Encounter for immunization: Secondary | ICD-10-CM | POA: Diagnosis not present

## 2023-12-12 ENCOUNTER — Encounter (INDEPENDENT_AMBULATORY_CARE_PROVIDER_SITE_OTHER): Admitting: Ophthalmology

## 2023-12-12 DIAGNOSIS — E113391 Type 2 diabetes mellitus with moderate nonproliferative diabetic retinopathy without macular edema, right eye: Secondary | ICD-10-CM

## 2023-12-12 DIAGNOSIS — E113312 Type 2 diabetes mellitus with moderate nonproliferative diabetic retinopathy with macular edema, left eye: Secondary | ICD-10-CM

## 2023-12-12 DIAGNOSIS — H43813 Vitreous degeneration, bilateral: Secondary | ICD-10-CM | POA: Diagnosis not present

## 2023-12-12 DIAGNOSIS — Z7985 Long-term (current) use of injectable non-insulin antidiabetic drugs: Secondary | ICD-10-CM

## 2023-12-12 DIAGNOSIS — Z7984 Long term (current) use of oral hypoglycemic drugs: Secondary | ICD-10-CM | POA: Diagnosis not present

## 2023-12-20 NOTE — Progress Notes (Signed)
 BURRIS MATHERNE                                          MRN: [id number]   12/20/2023   The [facility] Quality Team Specialist reviewed this patient medical record for the purposes of chart review for care gap closure. The following were reviewed: chart review for care gap closure-kidney health evaluation for diabetes:eGFR  and uACR.    [facility] Quality Team

## 2024-01-22 ENCOUNTER — Encounter (INDEPENDENT_AMBULATORY_CARE_PROVIDER_SITE_OTHER): Admitting: Ophthalmology

## 2024-01-22 DIAGNOSIS — Z7984 Long term (current) use of oral hypoglycemic drugs: Secondary | ICD-10-CM

## 2024-01-22 DIAGNOSIS — E113312 Type 2 diabetes mellitus with moderate nonproliferative diabetic retinopathy with macular edema, left eye: Secondary | ICD-10-CM | POA: Diagnosis not present

## 2024-01-22 DIAGNOSIS — E113391 Type 2 diabetes mellitus with moderate nonproliferative diabetic retinopathy without macular edema, right eye: Secondary | ICD-10-CM

## 2024-01-22 DIAGNOSIS — H35372 Puckering of macula, left eye: Secondary | ICD-10-CM | POA: Diagnosis not present

## 2024-01-22 DIAGNOSIS — Z7985 Long-term (current) use of injectable non-insulin antidiabetic drugs: Secondary | ICD-10-CM | POA: Diagnosis not present

## 2024-01-22 DIAGNOSIS — H43813 Vitreous degeneration, bilateral: Secondary | ICD-10-CM

## 2024-03-04 ENCOUNTER — Encounter (INDEPENDENT_AMBULATORY_CARE_PROVIDER_SITE_OTHER): Admitting: Ophthalmology
